# Patient Record
Sex: Female | Born: 1990 | Race: Black or African American | Hispanic: No | Marital: Single | Smoking: Never smoker
Health system: Southern US, Community
[De-identification: ages and names within clinical notes are randomized; demographics above are authoritative.]

## PROBLEM LIST (undated history)

## (undated) ENCOUNTER — Inpatient Hospital Stay (HOSPITAL_COMMUNITY): Payer: BC Managed Care – PPO | Admitting: Obstetrics & Gynecology

## (undated) DIAGNOSIS — Z789 Other specified health status: Secondary | ICD-10-CM

## (undated) HISTORY — PX: NO PAST SURGERIES: SHX2092

---
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
## 2013-02-15 NOTE — L&D Delivery Note (Signed)
Final Labor Progress Note At 0300 pt reports an increased in rectal pressure.  VE AL/C/+1.  FHR remained reassuring with occasional variable decelerations.  Vaginal Delivery Note The pt utilized IV medicine as pain management.   Artificial rupture of membranes today, at 0222, clear.  GBS was negative.  cervical dilation was complete at  0330.  NICHD Category 2.    Pushing with guidance began at  0415.   After 30 minutes of pushing the head, shoulders and the body of a viable female infant "Ava" delivered spontaneously with maternal effort in the ROA in a compound position at 0445/   With vigorous tone and spontaneous cry, the infant was placed on moms abd.  After the umbilical cord was clamped it was cut by the FOB, then cord blood was obtained for evaluation.  Spontaneous delivery of a intact placenta with a 3 vessel cord via Shultz at  [phone].   Episiotomy: None   The vulva, perineum, vaginal vault, rectum and cervix were inspected and revealed a 1 degree vaginal which was repair using a 3-0 vicryl on a CT needle and a superficial left labial laceration which was repaired using a 4-0 vicryl on a SH needle with 25cc of 1% lidocaine.  Postpartum pitocin as ordered.  Fundus firm, lochia minimum, bleeding under control. EBL 150, Pt hemodynamically stable.   Sponge, laps and needle count correct and verified with the primary care nurse.  Attending MD available at all times.    Mom and baby were left in stable condition, baby skin to skin. Routine postpartum orders   Mother unsure about method of contraception   Placenta to pathology: NO     Cord Gases sent to lab: NO Cord blood sent to lab: YES   APGARS:  8 at 1 minute and 9 at 5 minutes Weight:. 6lb 14oz     Felicitas Sine, CNM, MSN 01/14/2014. 5:50 AM

## 2013-06-14 LAB — OB RESULTS CONSOLE HIV ANTIBODY (ROUTINE TESTING): HIV: NONREACTIVE

## 2013-06-14 LAB — OB RESULTS CONSOLE ABO/RH: RH Type: POSITIVE

## 2013-06-14 LAB — OB RESULTS CONSOLE RPR: RPR: NONREACTIVE

## 2013-06-14 LAB — OB RESULTS CONSOLE HEPATITIS B SURFACE ANTIGEN: HEP B S AG: NEGATIVE

## 2013-06-14 LAB — OB RESULTS CONSOLE RUBELLA ANTIBODY, IGM: Rubella: IMMUNE

## 2013-06-14 LAB — OB RESULTS CONSOLE ANTIBODY SCREEN: Antibody Screen: NEGATIVE

## 2013-12-21 LAB — OB RESULTS CONSOLE GC/CHLAMYDIA
Chlamydia: NEGATIVE
Gonorrhea: NEGATIVE

## 2013-12-24 LAB — OB RESULTS CONSOLE GBS: STREP GROUP B AG: NEGATIVE

## 2014-01-13 ENCOUNTER — Inpatient Hospital Stay (HOSPITAL_COMMUNITY)
Admission: AD | Admit: 2014-01-13 | Discharge: 2014-01-16 | DRG: 775 | Disposition: A | Discharge status: Home / Self Care | Payer: BC Managed Care – PPO | Source: Ambulatory Visit | Attending: Obstetrics and Gynecology | Admitting: Obstetrics and Gynecology

## 2014-01-13 ENCOUNTER — Encounter (HOSPITAL_COMMUNITY): Payer: Self-pay | Admitting: *Deleted

## 2014-01-13 ENCOUNTER — Encounter (HOSPITAL_COMMUNITY): Payer: Self-pay

## 2014-01-13 ENCOUNTER — Inpatient Hospital Stay (HOSPITAL_COMMUNITY)
Admission: AD | Admit: 2014-01-13 | Discharge: 2014-01-13 | Disposition: A | Discharge status: Home / Self Care | Payer: BC Managed Care – PPO | Source: Ambulatory Visit | Attending: Obstetrics and Gynecology | Admitting: Obstetrics and Gynecology

## 2014-01-13 DIAGNOSIS — O480 Post-term pregnancy: Secondary | ICD-10-CM | POA: Diagnosis present

## 2014-01-13 DIAGNOSIS — O471 False labor at or after 37 completed weeks of gestation: Secondary | ICD-10-CM | POA: Insufficient documentation

## 2014-01-13 DIAGNOSIS — Z3403 Encounter for supervision of normal first pregnancy, third trimester: Secondary | ICD-10-CM | POA: Diagnosis present

## 2014-01-13 DIAGNOSIS — O9902 Anemia complicating childbirth: Secondary | ICD-10-CM | POA: Diagnosis present

## 2014-01-13 DIAGNOSIS — D649 Anemia, unspecified: Secondary | ICD-10-CM | POA: Diagnosis present

## 2014-01-13 DIAGNOSIS — Z6835 Body mass index (BMI) 35.0-35.9, adult: Secondary | ICD-10-CM | POA: Diagnosis not present

## 2014-01-13 DIAGNOSIS — O99214 Obesity complicating childbirth: Secondary | ICD-10-CM | POA: Diagnosis present

## 2014-01-13 DIAGNOSIS — Z3A40 40 weeks gestation of pregnancy: Secondary | ICD-10-CM | POA: Diagnosis not present

## 2014-01-13 HISTORY — DX: Other specified health status: Z78.9

## 2014-01-13 LAB — CBC
HEMATOCRIT: 33.7 % — AB (ref 36.0–46.0)
Hemoglobin: 11 g/dL — ABNORMAL LOW (ref 12.0–15.0)
MCH: 26.9 pg (ref 26.0–34.0)
MCHC: 32.6 g/dL (ref 30.0–36.0)
MCV: 82.4 fL (ref 78.0–100.0)
PLATELETS: 243 10*3/uL (ref 150–400)
RBC: 4.09 MIL/uL (ref 3.87–5.11)
RDW: 14.2 % (ref 11.5–15.5)
WBC: 11.6 10*3/uL — ABNORMAL HIGH (ref 4.0–10.5)

## 2014-01-13 MED ORDER — LACTATED RINGERS IV SOLN
INTRAVENOUS | Status: DC
  Administered 2014-01-13 (×2): via INTRAVENOUS

## 2014-01-13 MED ORDER — OXYTOCIN 40 UNITS IN LACTATED RINGERS INFUSION - SIMPLE MED
1.0000 m[IU]/min | INTRAVENOUS | Status: DC
Start: 2014-01-13 — End: 2014-01-14
  Filled 2014-01-13: qty 1000

## 2014-01-13 MED ORDER — OXYTOCIN 40 UNITS IN LACTATED RINGERS INFUSION - SIMPLE MED
62.5000 mL/h | INTRAVENOUS | Status: DC
  Administered 2014-01-14: 62.5 mL/h via INTRAVENOUS

## 2014-01-13 MED ORDER — OXYCODONE-ACETAMINOPHEN 5-325 MG PO TABS: 2.0000 | ORAL_TABLET | ORAL | Status: DC | PRN

## 2014-01-13 MED ORDER — ONDANSETRON HCL 4 MG/2ML IJ SOLN: 4.0000 mg | Freq: Four times a day (QID) | INTRAMUSCULAR | Status: DC | PRN

## 2014-01-13 MED ORDER — OXYCODONE-ACETAMINOPHEN 5-325 MG PO TABS: 1.0000 | ORAL_TABLET | ORAL | Status: DC | PRN

## 2014-01-13 MED ORDER — OXYTOCIN BOLUS FROM INFUSION
500.0000 mL | INTRAVENOUS | Status: DC
  Administered 2014-01-14: 500 mL via INTRAVENOUS

## 2014-01-13 MED ORDER — LACTATED RINGERS IV SOLN
500.0000 mL | INTRAVENOUS | Status: DC | PRN
  Administered 2014-01-14: 500 mL via INTRAVENOUS

## 2014-01-13 MED ORDER — CITRIC ACID-SODIUM CITRATE 334-500 MG/5ML PO SOLN: 30.0000 mL | ORAL | Status: DC | PRN

## 2014-01-13 MED ORDER — LIDOCAINE HCL (PF) 1 % IJ SOLN
30.0000 mL | INTRAMUSCULAR | Status: DC | PRN
  Administered 2014-01-14: 30 mL via SUBCUTANEOUS
  Filled 2014-01-13: qty 30

## 2014-01-13 MED ORDER — SODIUM CHLORIDE 0.9 % IV SOLN: 250.0000 mL | INTRAVENOUS | Status: DC | PRN

## 2014-01-13 MED ORDER — SODIUM CHLORIDE 0.9 % IJ SOLN: 3.0000 mL | INTRAMUSCULAR | Status: DC | PRN

## 2014-01-13 MED ORDER — TERBUTALINE SULFATE 1 MG/ML IJ SOLN: 0.2500 mg | Freq: Once | INTRAMUSCULAR | Status: AC | PRN

## 2014-01-13 MED ORDER — NALBUPHINE HCL 10 MG/ML IJ SOLN
5.0000 mg | INTRAMUSCULAR | Status: DC | PRN
  Administered 2014-01-13: 5 mg via INTRAVENOUS
  Filled 2014-01-13: qty 1

## 2014-01-13 MED ORDER — ACETAMINOPHEN 325 MG PO TABS: 650.0000 mg | ORAL_TABLET | ORAL | Status: DC | PRN

## 2014-01-13 MED ORDER — SODIUM CHLORIDE 0.9 % IJ SOLN: 3.0000 mL | Freq: Two times a day (BID) | INTRAMUSCULAR | Status: DC

## 2014-01-13 NOTE — MAU Note (Signed)
Pt presents to MAU with complaints of contractions that started around 3am. Denies any vaginal bleeding or LOF

## 2014-01-13 NOTE — MAU Note (Signed)
Contractions worsening this pm

## 2014-01-13 NOTE — H&P (Signed)
[name] is a 23 y.o. female, G1 P0 at 40.1 weeks  There are no active problems to display for this patient.   Pregnancy Course: Patient entered care at 10.5 weeks.   EDC of 01/12/14 was established by EDD.      US evaluations:   11.4 weeks - Dating: S c/w D, FHR 156, anterverted uterus,  19.6 weeks - Anatomy: wnl expect spine and nuchal fold not seen d/t fetal position,  EFW 11oz - 48%, FHR 156, cervix 3.44cm, vertex, no previa, placenta posterior normal fluid     22.0 weeks - FU: EFW 1lb 2oz - 53%, FHR 145, cervical length 3.54cm, vertex, posterior placenta, normal fluid, spine seen.    Significant prenatal events:   Obesity - BMI 36, mild anemia hgb 11.3 at prenatals, HSV! Last evaluation:   39.3 weeks   VE:1/50/-3 on 01/08/14  Reason for admission:  ctx  Pt States:   Contractions Frequency: q5-6         Contraction severity: strong         Fetal activity: +FM  Pt reports her allergy to codeine is n/v  OB History    Gravida Para Term Preterm AB TAB SAB Ectopic Multiple Living   1              Past Medical History  Diagnosis Date  . Medical history non-contributory    Past Surgical History  Procedure Laterality Date  . No past surgeries     Family History: family history is not on file. Social History:  reports that she has never smoked. She does not have any smokeless tobacco history on file. She reports that she does not drink alcohol or use illicit drugs.   Prenatal Transfer Tool  Maternal Diabetes: No Genetic Screening: Normal Maternal Ultrasounds/Referrals: Normal Fetal Ultrasounds or other Referrals:  None Maternal Substance Abuse:  No Significant Maternal Medications:  None Significant Maternal Lab Results: None   ROS:  See HPI above, all other systems are negative  Allergies  Allergen Reactions  . Codeine Nausea And Vomiting    Dilation: 3.5 Effacement (%): 80 Station: -3 Exam by:: Shepard Keltz CNM Blood pressure 129/77, pulse 104,  temperature 98.7 F (37.1 C), temperature source Oral, resp. rate 18, height 5\' 4"  (1.626 m), weight 204 lb (92.534 kg), last menstrual period 04/07/2013, SpO2 99 %.  Maternal Exam:  Uterine Assessment: Contraction frequency is rare.  Abdomen: Gravid, non tender. Fundal height is aga.  Normal external genitalia, vulva, cervix, uterus and adnexa.  No lesions noted on exam.  Pelvis adequate for delivery.  Fetal presentation: Vertex by VE  Fetal Exam:  Monitor Surveillance : Continuous Monitoring / Intermitting per -  Mode: Ultrasound.  NICHD: Category CTXs: Q 5-427minutes EFW 7bs   Physical Exam: Nursing note and vitals reviewed General: alert and cooperative She appears well nourished Psychiatric: Normal mood and affect. Her behavior is normal Head: Normocephalic Eyes: Pupils are equal, round, and reactive to light Neck: Normal range of motion Cardiovascular: RRR without murmur  Respiratory: CTAB. Effort normal  Abd: soft, non-tender, +BS, no rebound, no guarding  Genitourinary: Vagina normal  Neurological: A&Ox3 Skin: Warm and dry  Musculoskeletal: Normal range of motion  Homan's sign negative bilaterally No evidence of DVTs.  Edema: No edema DTR: 2+ Clonus: None   Prenatal labs: ABO, Rh:  O positive Antibody:  negative Rubella:   immune RPR:   NR HBsAg:   negative HIV:   NR GBS: Negative (11/09 0000) Sickle  cell/Hgb electrophoresis:  WNL Pap:  09/13/12 wnl GC:   negative Chlamydia: negative Genetic screenings:   Glucola:    Assessment:  IUP at 40.0 weeks NICHD: Category 1 Membranes: intact Bishop Score: 4 GBS negative  Plan:  Admit to L&D for expectant management of labor. Possible augmentation options reviewed including foley bulb, AROM and/or pitocin.   IV pain medication per orders PRN Epidural per patient request Foley cath after patient is comfortable with epidural Anticipate SVD  Labor mgmt as ordered  Okay to ambulate around unit with  wireless monitors  Okay to get up and shower without monitoring   May auscultate FHR intermittently,  if expectant management     q 30 min in active labor     q 15 min in transition     q 5 min with pushing.     May ambulate without monitoring.     If no active labor, may do NST q 2 hours.   Attending MD available at all times.  Nykia Turko, CNM, MSN 01/13/2014, 9:25 PM       All information will be confirmed upon admisson

## 2014-01-13 NOTE — Progress Notes (Signed)
Labor Progress  Subjective: Uncomfortable with each ctx, breathing thru it but request IV pain medicine  Objective: BP 129/77 mmHg  Pulse 104  Temp(Src) 98.7 F (37.1 C) (Oral)  Resp 18  Ht 5\' 4"  (1.626 m)  Wt 204 lb (92.534 kg)  BMI 35.00 kg/m2  SpO2 99%  LMP 04/07/2013     FHT:140, +accel, moderate variability, occasional variable decel CTX:  regular, every 4-5 minutes Uterus gravid, soft non tender SVE:  Dilation: 6 Effacement (%): 90 Station: -2 Exam by:: Hensley Aziz CNM   Assessment:  IUP at 40.1 weeks NICHD: Category 2 Membranes:  BBW Labor progress: active/progressing GBS: negative   Plan: Continue expectant labor management Rest/Ambulate/birthball Frequent position changes to facilitate fetal rotation and descent. Will reassess with cervical exam at 0300 or earlier if necessary IV pain medicine or epidural per pt request    Hannah Bishop, CNM, MSN 01/13/2014. 11:26 PM

## 2014-01-13 NOTE — MAU Provider Note (Signed)
Hannah Bishop is a [age] y.o. G1P0 at 40.0 weeks present to MAU c/0 ctx q 5 minutes since this morning at 0300.  She denies vb or lof w/+FM.    History     There are no active problems to display for this patient.   Chief Complaint  Patient presents with  . Labor Eval   HPI  OB History    Gravida Para Term Preterm AB TAB SAB Ectopic Multiple Living   1               History reviewed. No pertinent past medical history.  History reviewed. No pertinent past surgical history.  History reviewed. No pertinent family history.  History  Substance Use Topics  . Smoking status: Never Smoker   . Smokeless tobacco: Not on file  . Alcohol Use: No    Allergies:  Allergies  Allergen Reactions  . Codeine Nausea And Vomiting    Prescriptions prior to admission  Medication Sig Dispense Refill Last Dose  . Prenatal Vit-Fe Fumarate-FA (PRENATAL MULTIVITAMIN) TABS tablet Take 1 tablet by mouth daily at 12 noon.   01/12/2014 at Unknown time    ROS See HPI above, all other systems are negative  Physical Exam   Blood pressure 122/74, pulse 104, temperature 98.2 F (36.8 C), resp. rate 18, height 5\' 4"  (1.626 m), weight 204 lb (92.534 kg), last menstrual period 04/07/2013.  Physical Exam Ext:  WNL ABD: Soft, non tender to palpation, no rebound or guarding SVE: 2-3/70/-2 soft, vertex by A Gagliardo RN   ED Course  Assessment: IUP at  40.1weeks Membranes: intact FHR: Category 1 CTX:  occasional   Plan: 1 hr observation for cervical changes If not significant give pt the option to  1) DC to home with labor precaution or 2) ambulate for 1-2 hours and be recheck for cervical change    Zian Mohamed, CNM, MSN 01/13/2014. 9:10 AM

## 2014-01-13 NOTE — MAU Provider Note (Signed)
Hannah Bishop is a 23 y.o. G1P0 at 40.1 weeks returned to MAU c/o stronger ctx than earlier   History     There are no active problems to display for this patient.   Chief Complaint  Patient presents with  . Labor Eval   HPI  OB History    Gravida Para Term Preterm AB TAB SAB Ectopic Multiple Living   1               Past Medical History  Diagnosis Date  . Medical history non-contributory     Past Surgical History  Procedure Laterality Date  . No past surgeries      History reviewed. No pertinent family history.  History  Substance Use Topics  . Smoking status: Never Smoker   . Smokeless tobacco: Not on file  . Alcohol Use: No    Allergies:  Allergies  Allergen Reactions  . Codeine Nausea And Vomiting    Prescriptions prior to admission  Medication Sig Dispense Refill Last Dose  . Prenatal Vit-Fe Fumarate-FA (PRENATAL MULTIVITAMIN) TABS tablet Take 1 tablet by mouth daily at 12 noon.   01/13/2014 at Unknown time    ROS See HPI above, all other systems are negative  Physical Exam   Blood pressure 129/77, pulse 104, temperature 98.7 F (37.1 C), temperature source Oral, resp. rate 18, height 5\' 4"  (1.626 m), weight 204 lb (92.534 kg), last menstrual period 04/07/2013, SpO2 99 %.  Physical Exam Ext:  WNL ABD: Soft, non tender to palpation, no rebound or guarding SVE: 3-4/80/-3   ED Course  Assessment: IUP at  40.1weeks Membranes: BBW FHR: Category 1 CTX:  occassional  Plan: Admit to Labor for expectant labor management  Shonica Weier, CNM, MSN 01/13/2014. 9:22 PM

## 2014-01-13 NOTE — Discharge Instructions (Signed)

## 2014-01-13 NOTE — MAU Provider Note (Signed)
MAU Addendum Note  No results found for this or any previous visit (from the past 24 hour(s)). Pt elected to go home and return later  Plan: -Discussed need to follow up in office  -Bleeding and labor Precautions -Kick count instruction given -Encouraged to call if any questions or concerns arise prior to next scheduled office visit.  -Discharged to home in stable condition   Kanen Mottola, CNM, MSN 01/13/2014. 9:40 AM

## 2014-01-14 ENCOUNTER — Encounter (HOSPITAL_COMMUNITY): Payer: Self-pay | Admitting: General Practice

## 2014-01-14 LAB — RPR

## 2014-01-14 LAB — HIV ANTIBODY (ROUTINE TESTING W REFLEX): HIV 1&2 Ab, 4th Generation: NONREACTIVE

## 2014-01-14 MED ORDER — ONDANSETRON HCL 4 MG PO TABS
4.0000 mg | ORAL_TABLET | ORAL | Status: DC | PRN
Start: 2014-01-14 — End: 2014-01-16

## 2014-01-14 MED ORDER — LANOLIN HYDROUS EX OINT: TOPICAL_OINTMENT | CUTANEOUS | Status: DC | PRN

## 2014-01-14 MED ORDER — PRENATAL MULTIVITAMIN CH
1.0000 | ORAL_TABLET | Freq: Every day | ORAL | Status: DC
  Administered 2014-01-14 – 2014-01-16 (×3): 1 via ORAL
  Filled 2014-01-14 (×3): qty 1

## 2014-01-14 MED ORDER — DIPHENHYDRAMINE HCL 25 MG PO CAPS: 25.0000 mg | ORAL_CAPSULE | Freq: Four times a day (QID) | ORAL | Status: DC | PRN

## 2014-01-14 MED ORDER — INFLUENZA VAC SPLIT QUAD 0.5 ML IM SUSY: 0.5000 mL | PREFILLED_SYRINGE | INTRAMUSCULAR | Status: DC

## 2014-01-14 MED ORDER — WITCH HAZEL-GLYCERIN EX PADS: 1.0000 "application " | MEDICATED_PAD | CUTANEOUS | Status: DC | PRN

## 2014-01-14 MED ORDER — OXYCODONE-ACETAMINOPHEN 5-325 MG PO TABS
2.0000 | ORAL_TABLET | ORAL | Status: DC | PRN
Start: 2014-01-14 — End: 2014-01-16

## 2014-01-14 MED ORDER — BENZOCAINE-MENTHOL 20-0.5 % EX AERO
1.0000 "application " | INHALATION_SPRAY | CUTANEOUS | Status: DC | PRN
  Administered 2014-01-14: 1 via TOPICAL
  Filled 2014-01-14: qty 56

## 2014-01-14 MED ORDER — ONDANSETRON HCL 4 MG/2ML IJ SOLN
4.0000 mg | INTRAMUSCULAR | Status: DC | PRN
Start: 2014-01-14 — End: 2014-01-16

## 2014-01-14 MED ORDER — SIMETHICONE 80 MG PO CHEW
80.0000 mg | CHEWABLE_TABLET | ORAL | Status: DC | PRN
Start: 2014-01-14 — End: 2014-01-16

## 2014-01-14 MED ORDER — OXYCODONE-ACETAMINOPHEN 5-325 MG PO TABS
1.0000 | ORAL_TABLET | ORAL | Status: DC | PRN
  Administered 2014-01-14: 1 via ORAL
  Filled 2014-01-14: qty 1

## 2014-01-14 MED ORDER — DIBUCAINE 1 % RE OINT: 1.0000 "application " | TOPICAL_OINTMENT | RECTAL | Status: DC | PRN

## 2014-01-14 MED ORDER — SENNOSIDES-DOCUSATE SODIUM 8.6-50 MG PO TABS
2.0000 | ORAL_TABLET | ORAL | Status: DC
  Administered 2014-01-14 – 2014-01-15 (×2): 2 via ORAL
  Filled 2014-01-14 (×2): qty 2

## 2014-01-14 MED ORDER — IBUPROFEN 600 MG PO TABS
600.0000 mg | ORAL_TABLET | Freq: Four times a day (QID) | ORAL | Status: DC
  Administered 2014-01-14 – 2014-01-16 (×10): 600 mg via ORAL
  Filled 2014-01-14 (×9): qty 1

## 2014-01-14 MED ORDER — ZOLPIDEM TARTRATE 5 MG PO TABS
5.0000 mg | ORAL_TABLET | Freq: Every evening | ORAL | Status: DC | PRN
Start: 2014-01-14 — End: 2014-01-16

## 2014-01-14 MED ORDER — TETANUS-DIPHTH-ACELL PERTUSSIS 5-2.5-18.5 LF-MCG/0.5 IM SUSP: 0.5000 mL | Freq: Once | INTRAMUSCULAR | Status: DC

## 2014-01-14 NOTE — Progress Notes (Signed)
Labor Progress  Subjective: Managing the pain with breathing and family support  Objective: BP 96/80 mmHg  Pulse 111  Temp(Src) 98 F (36.7 C) (Oral)  Resp 18  Ht 5\' 4"  (1.626 m)  Wt 204 lb (92.534 kg)  BMI 35.00 kg/m2  SpO2 99%  LMP 04/07/2013     FHT: 130, min-mod variability, early decel,  CTX:  regular, every 3 minutes Uterus gravid, soft non tender SVE:  Dilation: 8.5 Effacement (%): 90 Station: 0 Exam by:: Diezel Mazur CNM  Assessment:  IUP at 40.2 weeks NICHD: Category 2 Membranes:  AROM clear 0222 Labor progress: transition GBS: negative   Plan: Continue labor plan Continuous/intermittent monitoring Rest/Ambulate Frequent position changes to facilitate fetal rotation and descent. Will reassess with cervical exam at 0400 or earlier if necessary    Liberta Gimpel, CNM, MSN 01/14/2014. 2:57 AM

## 2014-01-14 NOTE — Lactation Note (Signed)
This note was copied from the chart of Hannah Bishop. Lactation Consultation Note  Patient Name: Hannah Bishop's Date: 01/14/2014 Reason for consult: Follow-up assessment  Mom desired assistance in getting baby to breast, but baby is too sleepy to latch on at this time.  Hand expression attempted (so as to entice baby), but nothing was expressed at this time (Mom reports having seen colostrum earlier).  Mom reassured.  Sleepy behavior on the 1st day was discussed. Mom knows she can call for assist at any time.   Mom made aware of O/P services, breastfeeding support groups, community resources, and our phone # for post-discharge questions.   Lurline HareRichey, Keelyn Monjaras [facility] 01/14/2014, 2:05 PM

## 2014-01-14 NOTE — Progress Notes (Addendum)
Labor Progress  Subjective: Painful ctx, increase pressure, breathing thru each ctx, family at the bedisde  Objective: BP 129/77 mmHg  Pulse 104  Temp(Src) 98.7 F (37.1 C) (Oral)  Resp 18  Ht 5\' 4"  (1.626 m)  Wt 204 lb (92.534 kg)  BMI 35.00 kg/m2  SpO2 99%  LMP 04/07/2013     FHT: 135, moderate variability, + accel, no decels CTX:  regular, every 3-4 minutes Uterus gravid, soft non tender SVE:  Dilation: 7.5 Effacement (%): 90 Station: -1 Exam by:: Hannah Bishop   Assessment:  IUP at 40.2 weeks NICHD: Category 1 Membranes:  BBW Labor progress: adquate labor progress GBS: negative   Plan: Continue labor plan Continuous/intermittent monitoring Rest/Ambulate Frequent position changes to facilitate fetal rotation and descent. Will reassess with cervical exam at 0300 or earlier if necessary      Hannah Bishop, CNM, MSN 01/14/2014. 1:09 AM

## 2014-01-14 NOTE — Plan of Care (Signed)
Problem: Consults Goal: Postpartum Patient Education (See Patient Education module for education specifics.) Outcome: Completed/Met Date Met:  01/14/14 Goal: Skin Care Protocol Initiated - if Braden Score 18 or less If consults are not indicated, leave blank or document N/A Outcome: Completed/Met Date Met:  01/14/14

## 2014-01-14 NOTE — Lactation Note (Signed)
This note was copied from the chart of Hannah Crystalyn Manygoats. Lactation Consultation Note  P1, Assisted mother with latching in football hold.  FOB undressed baby. Encouraged STS. Rhythmical sucks and swallows observed, some with stimulation. Reviewed massaging breasts to keep her active.  Discussed cluster feeding.   Patient Name: Hannah Pietro CassisRaven Letts ZOXWR'UToday's Date: 01/14/2014 Reason for consult: Follow-up assessment   Maternal Data    Feeding Feeding Type: Breast Fed  LATCH Score/Interventions Latch: Grasps breast easily, tongue down, lips flanged, rhythmical sucking. Intervention(s): Adjust position;Assist with latch;Breast massage  Audible Swallowing: A few with stimulation Intervention(s): Skin to skin;Alternate breast massage  Type of Nipple: Everted at rest and after stimulation  Comfort (Breast/Nipple): Soft / non-tender     Hold (Positioning): Assistance needed to correctly position infant at breast and maintain latch.  LATCH Score: 8  Lactation Tools Discussed/Used     Consult Status Consult Status: Follow-up Date: 01/15/14 Follow-up type: In-patient    Dahlia ByesBerkelhammer, Ruth [facility] 01/14/2014, 5:57 PM

## 2014-01-14 NOTE — Lactation Note (Signed)
This note was copied from the chart of Hannah Hannah Bishop. Lactation Consultation Note  Patient Name: Hannah Bishop Reason for consult: Initial assessment  Initial visit attempted at 7 hours of life. Mom has visitors in room. Mom has my # to call when ready for consult/baby's feeding.  Lurline HareRichey, Shera [facility] Bishop, 12:35 PM

## 2014-01-15 LAB — CBC
HEMATOCRIT: 32.8 % — AB (ref 36.0–46.0)
Hemoglobin: 10.3 g/dL — ABNORMAL LOW (ref 12.0–15.0)
MCH: 26.8 pg (ref 26.0–34.0)
MCHC: 31.4 g/dL (ref 30.0–36.0)
MCV: 85.2 fL (ref 78.0–100.0)
Platelets: 252 10*3/uL (ref 150–400)
RBC: 3.85 MIL/uL — ABNORMAL LOW (ref 3.87–5.11)
RDW: 14.8 % (ref 11.5–15.5)
WBC: 11.5 10*3/uL — ABNORMAL HIGH (ref 4.0–10.5)

## 2014-01-15 NOTE — Progress Notes (Signed)
Hannah Bishop  Post Partum Day 1:S/P SVD with 1st Degree Vaginal and Superficial Left Labial  Subjective: Patient up ad lib, denies syncope or dizziness. Reports consuming regular diet without issues and denies N/V. Denies issues with urination and reports bleeding is "good."  Patient is breast feeding and reports going pretty good.  Unsure of desired birth control method for postpartum contraception.    Objective: Filed Vitals:   01/14/14 0800 01/14/14 1242 01/14/14 1737 01/15/14 0527  BP: 113/61 108/61 118/68 103/62  Pulse: 87 78 87 85  Temp: 98 F (36.7 C) 98 F (36.7 C) 98.2 F (36.8 C) 98.1 F (36.7 C)  TempSrc: Oral Oral Oral Oral  Resp: 18 18 18 18   Height:      Weight:      SpO2:        Recent Labs  01/13/14 2145 01/15/14 0620  HGB 11.0* 10.3*  HCT 33.7* 32.8*    Physical Exam:  General appearance: alert, cooperative and no distress Lungs: clear to auscultation bilaterally Breasts: normal appearance, no masses or tenderness Heart: regular rate and rhythm, S1, S2 normal, no murmur, click, rub or gallop Abdomen: soft, non-tender; bowel sounds normal; no masses,  no organomegaly Extremities: extremities normal, atraumatic, no cyanosis or edema Skin: Skin color, texture, turgor normal. No rashes or lesions Lochia: Scant Laceration: Not assessed d/t location Uterine Fundus: firm, U/-2 DVT Evaluation: Negative Homan's sign. No significant calf/ankle edema.  Assessment S/P Vaginal Delivery-Day 1 Normal [employer] Breastfeeding Unsure BCM Hemodynamically Stable  Plan: Discussed recommended BCM for new, breastfeeding mom--information added to discharge sheet Plan for discharge tomorrow per pediatrician recommendation Continue current care as ordered Dr. Carmela HurtE. [place] to be updated on patient status   Hannah Bishop, CNM 01/15/2014, 9:03 AM

## 2014-01-15 NOTE — Discharge Instructions (Signed)
Medroxyprogesterone injection [Contraceptive] What is this medicine? MEDROXYPROGESTERONE (me DROX ee proe JES te rone) contraceptive injections prevent pregnancy. They provide effective birth control for 3 months. Depo-subQ Provera 104 is also used for treating pain related to endometriosis. This medicine may be used for other purposes; ask your health care provider or pharmacist if you have questions. COMMON BRAND NAME(S): Depo-Provera, Depo-subQ Provera 104 What should I tell my health care provider before I take this medicine? They need to know if you have any of these conditions: -frequently drink alcohol -asthma -blood vessel disease or a history of a blood clot in the lungs or legs -bone disease such as osteoporosis -breast cancer -diabetes -eating disorder (anorexia nervosa or bulimia) -high blood pressure -HIV infection or AIDS -kidney disease -liver disease -mental depression -migraine -seizures (convulsions) -stroke -tobacco smoker -vaginal bleeding -an unusual or allergic reaction to medroxyprogesterone, other hormones, medicines, foods, dyes, or preservatives -pregnant or trying to get pregnant -breast-feeding How should I use this medicine? Depo-Provera Contraceptive injection is given into a muscle. Depo-subQ Provera 104 injection is given under the skin. These injections are given by a health care professional. You must not be pregnant before getting an injection. The injection is usually given during the first 5 days after the start of a menstrual period or 6 weeks after delivery of a baby. Talk to your pediatrician regarding the use of this medicine in children. Special care may be needed. These injections have been used in female children who have started having menstrual periods. Overdosage: If you think you have taken too much of this medicine contact a poison control center or emergency room at once. NOTE: This medicine is only for you. Do not share this medicine  with others. What if I miss a dose? Try not to miss a dose. You must get an injection once every 3 months to maintain birth control. If you cannot keep an appointment, call and reschedule it. If you wait longer than 13 weeks between Depo-Provera contraceptive injections or longer than 14 weeks between Depo-subQ Provera 104 injections, you could get pregnant. Use another method for birth control if you miss your appointment. You may also need a pregnancy test before receiving another injection. What may interact with this medicine? Do not take this medicine with any of the following medications: -bosentan This medicine may also interact with the following medications: -aminoglutethimide -antibiotics or medicines for infections, especially rifampin, rifabutin, rifapentine, and griseofulvin -aprepitant -barbiturate medicines such as phenobarbital or primidone -bexarotene -carbamazepine -medicines for seizures like ethotoin, felbamate, oxcarbazepine, phenytoin, topiramate -modafinil -St. John's wort This list may not describe all possible interactions. Give your health care provider a list of all the medicines, herbs, non-prescription drugs, or dietary supplements you use. Also tell them if you smoke, drink alcohol, or use illegal drugs. Some items may interact with your medicine. What should I watch for while using this medicine? This drug does not protect you against HIV infection (AIDS) or other sexually transmitted diseases. Use of this product may cause you to lose calcium from your bones. Loss of calcium may cause weak bones (osteoporosis). Only use this product for more than 2 years if other forms of birth control are not right for you. The longer you use this product for birth control the more likely you will be at risk for weak bones. Ask your health care professional how you can keep strong bones. You may have a change in bleeding pattern or irregular periods. Many females stop having    periods while taking this drug. If you have received your injections on time, your chance of being pregnant is very low. If you think you may be pregnant, see your health care professional as soon as possible. Tell your health care professional if you want to get pregnant within the next year. The effect of this medicine may last a long time after you get your last injection. What side effects may I notice from receiving this medicine? Side effects that you should report to your doctor or health care professional as soon as possible: -allergic reactions like skin rash, itching or hives, swelling of the face, lips, or tongue -breast tenderness or discharge -breathing problems -changes in vision -depression -feeling faint or lightheaded, falls -fever -pain in the abdomen, chest, groin, or leg -problems with balance, talking, walking -unusually weak or tired -yellowing of the eyes or skin Side effects that usually do not require medical attention (report to your doctor or health care professional if they continue or are bothersome): -acne -fluid retention and swelling -headache -irregular periods, spotting, or absent periods -temporary pain, itching, or skin reaction at site where injected -weight gain This list may not describe all possible side effects. Call your doctor for medical advice about side effects. You may report side effects to FDA at 1-800-FDA-1088. Where should I keep my medicine? This does not apply. The injection will be given to you by a health care professional. NOTE: This sheet is a summary. It may not cover all possible information. If you have questions about this medicine, talk to your doctor, pharmacist, or health care provider.  2015, [employer]. (2008-02-23 18:37:56) Etonogestrel implant What is this medicine? ETONOGESTREL (et oh noe JES trel) is a contraceptive (birth control) device. It is used to prevent pregnancy. It can be used for up to 3  years. This medicine may be used for other purposes; ask your health care provider or pharmacist if you have questions. COMMON BRAND NAME(S): Implanon, Nexplanon What should I tell my health care provider before I take this medicine? They need to know if you have any of these conditions: -abnormal vaginal bleeding -blood vessel disease or blood clots -cancer of the breast, cervix, or liver -depression -diabetes -gallbladder disease -headaches -heart disease or recent heart attack -high blood pressure -high cholesterol -kidney disease -liver disease -renal disease -seizures -tobacco smoker -an unusual or allergic reaction to etonogestrel, other hormones, anesthetics or antiseptics, medicines, foods, dyes, or preservatives -pregnant or trying to get pregnant -breast-feeding How should I use this medicine? This device is inserted just under the skin on the inner side of your upper arm by a health care professional. Talk to your pediatrician regarding the use of this medicine in children. Special care may be needed. Overdosage: If you think you've taken too much of this medicine contact a poison control center or emergency room at once. Overdosage: If you think you have taken too much of this medicine contact a poison control center or emergency room at once. NOTE: This medicine is only for you. Do not share this medicine with others. What if I miss a dose? This does not apply. What may interact with this medicine? Do not take this medicine with any of the following medications: -amprenavir -bosentan -fosamprenavir This medicine may also interact with the following medications: -barbiturate medicines for inducing sleep or treating seizures -certain medicines for fungal infections like ketoconazole and itraconazole -griseofulvin -medicines to treat seizures like carbamazepine, felbamate, oxcarbazepine, phenytoin, topiramate -modafinil -phenylbutazone -rifampin -some medicines  to treat HIV infection like atazanavir, indinavir, lopinavir, nelfinavir, tipranavir, ritonavir -St. John's wort This list may not describe all possible interactions. Give your health care provider a list of all the medicines, herbs, non-prescription drugs, or dietary supplements you use. Also tell them if you smoke, drink alcohol, or use illegal drugs. Some items may interact with your medicine. What should I watch for while using this medicine? This product does not protect you against HIV infection (AIDS) or other sexually transmitted diseases. You should be able to feel the implant by pressing your fingertips over the skin where it was inserted. Tell your doctor if you cannot feel the implant. What side effects may I notice from receiving this medicine? Side effects that you should report to your doctor or health care professional as soon as possible: -allergic reactions like skin rash, itching or hives, swelling of the face, lips, or tongue -breast lumps -changes in vision -confusion, trouble speaking or understanding -dark urine -depressed mood -general ill feeling or flu-like symptoms -light-colored stools -loss of appetite, nausea -right upper belly pain -severe headaches -severe pain, swelling, or tenderness in the abdomen -shortness of breath, chest pain, swelling in a leg -signs of pregnancy -sudden numbness or weakness of the face, arm or leg -trouble walking, dizziness, loss of balance or coordination -unusual vaginal bleeding, discharge -unusually weak or tired -yellowing of the eyes or skin Side effects that usually do not require medical attention (Report these to your doctor or health care professional if they continue or are bothersome.): -acne -breast pain -changes in weight -cough -fever or chills -headache -irregular menstrual bleeding -itching, burning, and vaginal discharge -pain or difficulty passing urine -sore throat This list may not describe all  possible side effects. Call your doctor for medical advice about side effects. You may report side effects to FDA at 1-800-FDA-1088. Where should I keep my medicine? This drug is given in a hospital or clinic and will not be stored at home. NOTE: This sheet is a summary. It may not cover all possible information. If you have questions about this medicine, talk to your doctor, pharmacist, or health care provider.  2015, [employer] Standard. (2011-08-09 15:37:45) Intrauterine Device Information An intrauterine device (IUD) is inserted into your uterus to prevent pregnancy. There are two types of IUDs available:   Copper IUD--This type of IUD is wrapped in copper wire and is placed inside the uterus. Copper makes the uterus and fallopian tubes produce a fluid that kills sperm. The copper IUD can stay in place for 10 years.  Hormone IUD--This type of IUD contains the hormone progestin (synthetic progesterone). The hormone thickens the cervical mucus and prevents sperm from entering the uterus. It also thins the uterine lining to prevent implantation of a fertilized egg. The hormone can weaken or kill the sperm that get into the uterus. One type of hormone IUD can stay in place for 5 years, and another type can stay in place for 3 years. Your health care provider will make sure you are a good candidate for a contraceptive IUD. Discuss with your health care provider the possible side effects.  ADVANTAGES OF AN INTRAUTERINE DEVICE  IUDs are highly effective, reversible, long acting, and low maintenance.   There are no estrogen-related side effects.   An IUD can be used when breastfeeding.   IUDs are not associated with weight gain.   The copper IUD works immediately after insertion.   The hormone IUD works right away if inserted within 7 days  of your period starting. You will need to use a backup method of birth control for 7 days if the hormone IUD is inserted at any other time in your  cycle.  The copper IUD does not interfere with your female hormones.   The hormone IUD can make heavy menstrual periods lighter and decrease cramping.   The hormone IUD can be used for 3 or 5 years.   The copper IUD can be used for 10 years. DISADVANTAGES OF AN INTRAUTERINE DEVICE  The hormone IUD can be associated with irregular bleeding patterns.   The copper IUD can make your menstrual flow heavier and more painful.   You may experience cramping and vaginal bleeding after insertion.  Document Released: 01/06/2004 Document Revised: 10/04/2012 Document Reviewed: 07/23/2012 [facility] Patient Information 2015 [place], [place]. This information is not intended to replace advice given to you by your health care provider. Make sure you discuss any questions you have with your health care provider.

## 2014-01-15 NOTE — Discharge Summary (Signed)
Vaginal Delivery Discharge Summary  ALL information will be verified prior to discharge  Hannah Bishop  DOB:    Sep 05, 1990 MRN:    [id number] CSN:    [id number]  Date of admission:                  01/13/14  Date of discharge:                   01/16/14  Procedures this admission: SVD   Date of Delivery: 01/14/14  Newborn Data:  Live born  Information for the patient's newborn:  Hannah, Bishop Girl Bishop [[id number]]  female  Live born female  Birth Weight: 6 lb 14 oz (3118 g) APGAR: 8, 9  Home with mother. Name: Hannah Bishop  History of Present Illness: Ms. Hannah Bishop is a 23 y.o. female, G1P1001, who presents at [redacted]w[redacted]d weeks gestation. The patient has been followed at the [facility] and Gynecology division of [facility]. She was admitted onset of labor. Her pregnancy has been complicated by:  Patient Active Problem List   Diagnosis Date Noted  . NSVD (normal spontaneous vaginal delivery) 01/14/2014    Hospital course: The patient was admitted for Labor.   Her labor was not complicated. She proceeded to have a vaginal delivery of a healthy infant. Her delivery was not complicated. Her postpartum course was not complicated. She was discharged to home on postpartum day 2 doing well.  Feeding: breast  Contraception: unsure of contraception Pt understands the risks birth control are not limited to irregular bleeding, formation of DVT, fluid fluctuations, elevation in blood pressure, stroke, breast tenderness and liver damage.  She states she will report any serious side effects.  She has been given verbal and written instructions and voiced a clear understanding.    Discharge hemoglobin: HEMOGLOBIN  Date Value Ref Range Status  01/15/2014 10.3* 12.0 - 15.0 g/dL Final   HCT  Date Value Ref Range Status  01/15/2014 32.8* 36.0 - 46.0 % Final    PreNatal Labs ABO, Rh: O/Positive/-- (04/30 0000)   Antibody: Negative (04/30 0000) Rubella:    immune RPR: NON REAC (11/29 2145)  HBsAg: Negative (04/30 0000)  HIV: NONREACTIVE (11/29 2145)  GBS: Negative (11/09 0000)  Discharge Physical Exam:  General: alert and cooperative Lochia: appropriate Uterine Fundus: firm Incision: healing well DVT Evaluation: No evidence of DVT seen on physical exam.  Intrapartum Procedures: spontaneous vaginal delivery Postpartum Procedures: none Complications-Operative and Postpartum: 1st Degree Vaginal and Superficial Left Labial degree perineal laceration  Discharge Diagnoses: Term Pregnancy-delivered, anemia  Activity:           pelvic rest Diet:                routine Medications: PNV, Ibuprofen, Iron and Percocet Condition:      stable     Postpartum Teaching: Nutrition, exercise, return to work or school, family visits, sexual activity, home rest, vaginal bleeding, pelvic rest, family planning, s/s of PPD, breast care peri-care and incision care   Discharge to: home  Follow-up Information    Follow up with [facility]. Schedule an appointment as soon as possible for a visit in 6 weeks.   Specialty:  Obstetrics and Gynecology   Why:  Postpartum check up   Contact information:   [address] [place] [address] [phone]       Adelina Mings, CNM, MSN 01/15/2014. 8:37 PM   Postpartum Care After Vaginal Delivery  After you deliver your newborn (postpartum period), the usual stay in the hospital is 24 72 hours. If there were problems with your labor or delivery, or if you have other medical problems, you might be in the hospital longer.  While you are in the hospital, you will receive help and instructions on how to care for yourself and your newborn during the postpartum period.  While you are in the hospital:  Be sure to tell your nurses if you have pain or discomfort, as well as where you feel the pain and what makes the pain worse.  If you had an incision  made near your vagina (episiotomy) or if you had some tearing during delivery, the nurses may put ice packs on your episiotomy or tear. The ice packs may help to reduce the pain and swelling.  If you are breastfeeding, you may feel uncomfortable contractions of your uterus for a couple of weeks. This is normal. The contractions help your uterus get back to normal size.  It is normal to have some bleeding after delivery.  For the first 1 3 days after delivery, the flow is red and the amount may be similar to a period.  It is common for the flow to start and stop.  In the first few days, you may pass some small clots. Let your nurses know if you begin to pass large clots or your flow increases.  Do not  flush blood clots down the toilet before having the nurse look at them.  During the next 3 10 days after delivery, your flow should become more watery and pink or brown-tinged in color.  Ten to fourteen days after delivery, your flow should be a small amount of yellowish-white discharge.  The amount of your flow will decrease over the first few weeks after delivery. Your flow may stop in 6 8 weeks. Most women have had their flow stop by 12 weeks after delivery.  You should change your sanitary pads frequently.  Wash your hands thoroughly with soap and water for at least 20 seconds after changing pads, using the toilet, or before holding or feeding your newborn.  You should feel like you need to empty your bladder within the first 6 8 hours after delivery.  In case you become weak, lightheaded, or faint, call your nurse before you get out of bed for the first time and before you take a shower for the first time.  Within the first few days after delivery, your breasts may begin to feel tender and full. This is called engorgement. Breast tenderness usually goes away within 48 72 hours after engorgement occurs. You may also notice milk leaking from your breasts. If you are not breastfeeding, do  not stimulate your breasts. Breast stimulation can make your breasts produce more milk.  Spending as much time as possible with your newborn is very important. During this time, you and your newborn can feel close and get to know each other. Having your newborn stay in your room (rooming in) will help to strengthen the bond with your newborn. It will give you time to get to know your newborn and become comfortable caring for your newborn.  Your hormones change after delivery. Sometimes the hormone changes can temporarily cause you to feel sad or tearful. These feelings should not last more than a few days. If these feelings last longer than that, you should talk to your caregiver.  If desired, talk to your caregiver about methods of family planning  or contraception.  Talk to your caregiver about immunizations. Your caregiver may want you to have the following immunizations before leaving the hospital:  Tetanus, diphtheria, and pertussis (Tdap) or tetanus and diphtheria (Td) immunization. It is very important that you and your family (including grandparents) or others caring for your newborn are up-to-date with the Tdap or Td immunizations. The Tdap or Td immunization can help protect your newborn from getting ill.  Rubella immunization.  Varicella (chickenpox) immunization.  Influenza immunization. You should receive this annual immunization if you did not receive the immunization during your pregnancy. Document Released: 11/29/2006 Document Revised: 10/27/2011 Document Reviewed: 09/29/2011 [facility] Patient Information 2014 [place], [place].   Postpartum Depression and Baby Blues  The postpartum period begins right after the birth of a baby. During this time, there is often a great amount of joy and excitement. It is also a time of considerable changes in the life of the parent(s). Regardless of how many times a mother gives birth, each child brings new challenges and dynamics to the family.  It is not unusual to have feelings of excitement accompanied by confusing shifts in moods, emotions, and thoughts. All mothers are at risk of developing postpartum depression or the "baby blues." These mood changes can occur right after giving birth, or they may occur many months after giving birth. The baby blues or postpartum depression can be mild or severe. Additionally, postpartum depression can resolve rather quickly, or it can be a long-term condition. CAUSES Elevated hormones and their rapid decline are thought to be a main cause of postpartum depression and the baby blues. There are a number of hormones that radically change during and after pregnancy. Estrogen and progesterone usually decrease immediately after delivering your baby. The level of thyroid hormone and various cortisol steroids also rapidly drop. Other factors that play a major role in these changes include major life events and genetics.  RISK FACTORS If you have any of the following risks for the baby blues or postpartum depression, know what symptoms to watch out for during the postpartum period. Risk factors that may increase the likelihood of getting the baby blues or postpartum depression include: 1. Havinga personal or family history of depression. 2. Having depression while being pregnant. 3. Having premenstrual or oral contraceptive-associated mood issues. 4. Having exceptional life stress. 5. Having marital conflict. 6. Lacking a social support network. 7. Having a baby with special needs. 8. Having health problems such as diabetes. SYMPTOMS Baby blues symptoms include:  Brief fluctuations in mood, such as going from extreme happiness to sadness.  Decreased concentration.  Difficulty sleeping.  Crying spells, tearfulness.  Irritability.  Anxiety. Postpartum depression symptoms typically begin within the first month after giving birth. These symptoms include:  Difficulty sleeping or excessive  sleepiness.  Marked weight loss.  Agitation.  Feelings of worthlessness.  Lack of interest in activity or food. Postpartum psychosis is a very concerning condition and can be dangerous. Fortunately, it is rare. Displaying any of the following symptoms is cause for immediate medical attention. Postpartum psychosis symptoms include:  Hallucinations and delusions.  Bizarre or disorganized behavior.  Confusion or disorientation. DIAGNOSIS  A diagnosis is made by an evaluation of your symptoms. There are no medical or lab tests that lead to a diagnosis, but there are various questionnaires that a caregiver may use to identify those with the baby blues, postpartum depression, or psychosis. Often times, a screening tool called the [place] Postnatal Depression Scale is used to diagnose depression in  the postpartum period.  TREATMENT The baby blues usually goes away on its own in 1 to 2 weeks. Social support is often all that is needed. You should be encouraged to get adequate sleep and rest. Occasionally, you may be given medicines to help you sleep.  Postpartum depression requires treatment as it can last several months or longer if it is not treated. Treatment may include individual or group therapy, medicine, or both to address any social, physiological, and psychological factors that may play a role in the depression. Regular exercise, a healthy diet, rest, and social support may also be strongly recommended.  Postpartum psychosis is more serious and needs treatment right away. Hospitalization is often needed. HOME CARE INSTRUCTIONS  Get as much rest as you can. Nap when the baby sleeps.  Exercise regularly. Some women find yoga and walking to be beneficial.  Eat a balanced and nourishing diet.  Do little things that you enjoy. Have a cup of tea, take a bubble bath, read your favorite magazine, or listen to your favorite music.  Avoid alcohol.  Ask for help with household chores,  cooking, grocery shopping, or running errands as needed. Do not try to do everything.  Talk to people close to you about how you are feeling. Get support from your partner, family members, friends, or other new moms.  Try to stay positive in how you think. Think about the things you are grateful for.  Do not spend a lot of time alone.  Only take medicine as directed by your caregiver.  Keep all your postpartum appointments.  Let your caregiver know if you have any concerns. SEEK MEDICAL CARE IF: You are having a reaction or problems with your medicine. SEEK IMMEDIATE MEDICAL CARE IF:  You have suicidal feelings.  You feel you may harm the baby or someone else. Document Released: 11/06/2003 Document Revised: 04/26/2011 Document Reviewed: 12/08/2010 [facility] Patient Information 2014 [place], [place].     Breastfeeding Deciding to breastfeed is one of the best choices you can make for you and your baby. A change in hormones during pregnancy causes your breast tissue to grow and increases the number and size of your milk ducts. These hormones also allow proteins, sugars, and fats from your blood supply to make breast milk in your milk-producing glands. Hormones prevent breast milk from being released before your baby is born as well as prompt milk flow after birth. Once breastfeeding has begun, thoughts of your baby, as well as his or her sucking or crying, can stimulate the release of milk from your milk-producing glands.  BENEFITS OF BREASTFEEDING For Your Baby  Your first milk (colostrum) helps your baby's digestive system function better.   There are antibodies in your milk that help your baby fight off infections.   Your baby has a lower incidence of asthma, allergies, and sudden infant death syndrome.   The nutrients in breast milk are better for your baby than infant formulas and are designed uniquely for your baby's needs.   Breast milk improves your baby's brain  development.   Your baby is less likely to develop other conditions, such as childhood obesity, asthma, or type 2 diabetes mellitus.  For You   Breastfeeding helps to create a very special bond between you and your baby.   Breastfeeding is convenient. Breast milk is always available at the correct temperature and costs nothing.   Breastfeeding helps to burn calories and helps you lose the weight gained during pregnancy.   Breastfeeding  makes your uterus contract to its prepregnancy size faster and slows bleeding (lochia) after you give birth.   Breastfeeding helps to lower your risk of developing type 2 diabetes mellitus, osteoporosis, and breast or ovarian cancer later in life. SIGNS THAT YOUR BABY IS HUNGRY Early Signs of Hunger  Increased alertness or activity.  Stretching.  Movement of the head from side to side.  Movement of the head and opening of the mouth when the corner of the mouth or cheek is stroked (rooting).  Increased sucking sounds, smacking lips, cooing, sighing, or squeaking.  Hand-to-mouth movements.  Increased sucking of fingers or hands. Late Signs of Hunger  Fussing.  Intermittent crying. Extreme Signs of Hunger Signs of extreme hunger will require calming and consoling before your baby will be able to breastfeed successfully. Do not wait for the following signs of extreme hunger to occur before you initiate breastfeeding:   Restlessness.  A loud, strong cry.   Screaming.   BREASTFEEDING BASICS Breastfeeding Initiation  Find a comfortable place to sit or lie down, with your neck and back well supported.  Place a pillow or rolled up blanket under your baby to bring him or her to the level of your breast (if you are seated). Nursing pillows are specially designed to help support your arms and your baby while you breastfeed.  Make sure that your baby's abdomen is facing your abdomen.   Gently massage your breast. With your fingertips,  massage from your chest wall toward your nipple in a circular motion. This encourages milk flow. You may need to continue this action during the feeding if your milk flows slowly.  Support your breast with 4 fingers underneath and your thumb above your nipple. Make sure your fingers are well away from your nipple and your baby's mouth.   Stroke your baby's lips gently with your finger or nipple.   When your baby's mouth is open wide enough, quickly bring your baby to your breast, placing your entire nipple and as much of the colored area around your nipple (areola) as possible into your baby's mouth.   More areola should be visible above your baby's upper lip than below the lower lip.   Your baby's tongue should be between his or her lower gum and your breast.   Ensure that your baby's mouth is correctly positioned around your nipple (latched). Your baby's lips should create a seal on your breast and be turned out (everted).  It is common for your baby to suck about 2-3 minutes in order to start the flow of breast milk. Latching Teaching your baby how to latch on to your breast properly is very important. An improper latch can cause nipple pain and decreased milk supply for you and poor weight gain in your baby. Also, if your baby is not latched onto your nipple properly, he or she may swallow some air during feeding. This can make your baby fussy. Burping your baby when you switch breasts during the feeding can help to get rid of the air. However, teaching your baby to latch on properly is still the best way to prevent fussiness from swallowing air while breastfeeding. Signs that your baby has successfully latched on to your nipple:    Silent tugging or silent sucking, without causing you pain.   Swallowing heard between every 3-4 sucks.    Muscle movement above and in front of his or her ears while sucking.  Signs that your baby has not successfully latched on  to nipple:    Sucking sounds or smacking sounds from your baby while breastfeeding.  Nipple pain. If you think your baby has not latched on correctly, slip your finger into the corner of your baby's mouth to break the suction and place it between your baby's gums. Attempt breastfeeding initiation again. Signs of Successful Breastfeeding Signs from your baby:   A gradual decrease in the number of sucks or complete cessation of sucking.   Falling asleep.   Relaxation of his or her body.   Retention of a small amount of milk in his or her mouth.   Letting go of your breast by himself or herself. Signs from you:  Breasts that have increased in firmness, weight, and size 1-3 hours after feeding.   Breasts that are softer immediately after breastfeeding.  Increased milk volume, as well as a change in milk consistency and color by the fifth day of breastfeeding.   Nipples that are not sore, cracked, or bleeding. Signs That Your Pecola LeisureBaby is Getting Enough Milk  Wetting at least 3 diapers in a 24-hour period. The urine should be clear and pale yellow by age 30 days.  At least 3 stools in a 24-hour period by age 30 days. The stool should be soft and yellow.  At least 3 stools in a 24-hour period by age 23 days. The stool should be seedy and yellow.  No loss of weight greater than 10% of birth weight during the first 253 days of age.  Average weight gain of 4-7 ounces (113-198 g) per week after age 45 days.  Consistent daily weight gain by age 30 days, without weight loss after the age of 2 weeks. After a feeding, your baby may spit up a small amount. This is common. BREASTFEEDING FREQUENCY AND DURATION Frequent feeding will help you make more milk and can prevent sore nipples and breast engorgement. Breastfeed when you feel the need to reduce the fullness of your breasts or when your baby shows signs of hunger. This is called "breastfeeding on demand." Avoid introducing a pacifier to your baby while  you are working to establish breastfeeding (the first 4-6 weeks after your baby is born). After this time you may choose to use a pacifier. Research has shown that pacifier use during the first year of a baby's life decreases the risk of sudden infant death syndrome (SIDS). Allow your baby to feed on each breast as long as he or she wants. Breastfeed until your baby is finished feeding. When your baby unlatches or falls asleep while feeding from the first breast, offer the second breast. Because newborns are often sleepy in the first few weeks of life, you may need to awaken your baby to get him or her to feed. Breastfeeding times will vary from baby to baby. However, the following rules can serve as a guide to help you ensure that your baby is properly fed:  Newborns (babies 324 weeks of age or younger) may breastfeed every 1-3 hours.  Newborns should not go longer than 3 hours during the day or 5 hours during the night without breastfeeding.  You should breastfeed your baby a minimum of 8 times in a 24-hour period until you begin to introduce solid foods to your baby at around 626 months of age. BREAST MILK PUMPING Pumping and storing breast milk allows you to ensure that your baby is exclusively fed your breast milk, even at times when you are unable to breastfeed. This is especially important if  you are going back to work while you are still breastfeeding or when you are not able to be present during feedings. Your lactation consultant can give you guidelines on how long it is safe to store breast milk.  A breast pump is a machine that allows you to pump milk from your breast into a sterile bottle. The pumped breast milk can then be stored in a refrigerator or freezer. Some breast pumps are operated by hand, while others use electricity. Ask your lactation consultant which type will work best for you. Breast pumps can be purchased, but some hospitals and breastfeeding support groups lease breast pumps on  a monthly basis. A lactation consultant can teach you how to hand express breast milk, if you prefer not to use a pump.  CARING FOR YOUR BREASTS WHILE YOU BREASTFEED Nipples can become dry, cracked, and sore while breastfeeding. The following recommendations can help keep your breasts moisturized and healthy:  Avoid using soap on your nipples.   Wear a supportive bra. Although not required, special nursing bras and tank tops are designed to allow access to your breasts for breastfeeding without taking off your entire bra or top. Avoid wearing underwire-style bras or extremely tight bras.  Air dry your nipples for 3-624minutes after each feeding.   Use only cotton bra pads to absorb leaked breast milk. Leaking of breast milk between feedings is normal.   Use lanolin on your nipples after breastfeeding. Lanolin helps to maintain your skin's normal moisture barrier. If you use pure lanolin, you do not need to wash it off before feeding your baby again. Pure lanolin is not toxic to your baby. You may also hand express a few drops of breast milk and gently massage that milk into your nipples and allow the milk to air dry. In the first few weeks after giving birth, some women experience extremely full breasts (engorgement). Engorgement can make your breasts feel heavy, warm, and tender to the touch. Engorgement peaks within 3-5 days after you give birth. The following recommendations can help ease engorgement:  Completely empty your breasts while breastfeeding or pumping. You may want to start by applying warm, moist heat (in the shower or with warm water-soaked hand towels) just before feeding or pumping. This increases circulation and helps the milk flow. If your baby does not completely empty your breasts while breastfeeding, pump any extra milk after he or she is finished.  Wear a snug bra (nursing or regular) or tank top for 1-2 days to signal your body to slightly decrease milk  production.  Apply ice packs to your breasts, unless this is too uncomfortable for you.  Make sure that your baby is latched on and positioned properly while breastfeeding. If engorgement persists after 48 hours of following these recommendations, contact your health care provider or a Advertising copywriterlactation consultant. OVERALL HEALTH CARE RECOMMENDATIONS WHILE BREASTFEEDING  Eat healthy foods. Alternate between meals and snacks, eating 3 of each per day. Because what you eat affects your breast milk, some of the foods may make your baby more irritable than usual. Avoid eating these foods if you are sure that they are negatively affecting your baby.  Drink milk, fruit juice, and water to satisfy your thirst (about 10 glasses a day).   Rest often, relax, and continue to take your prenatal vitamins to prevent fatigue, stress, and anemia.  Continue breast self-awareness checks.  Avoid chewing and smoking tobacco.  Avoid alcohol and drug use. Some medicines that may be harmful  to your baby can pass through breast milk. It is important to ask your health care provider before taking any medicine, including all over-the-counter and prescription medicine as well as vitamin and herbal supplements. It is possible to become pregnant while breastfeeding. If birth control is desired, ask your health care provider about options that will be safe for your baby. SEEK MEDICAL CARE IF:   You feel like you want to stop breastfeeding or have become frustrated with breastfeeding.  You have painful breasts or nipples.  Your nipples are cracked or bleeding.  Your breasts are red, tender, or warm.  You have a swollen area on either breast.  You have a fever or chills.  You have nausea or vomiting.  You have drainage other than breast milk from your nipples.  Your breasts do not become full before feedings by the fifth day after you give birth.  You feel sad and depressed.  Your baby is too sleepy to eat  well.  Your baby is having trouble sleeping.   Your baby is wetting less than 3 diapers in a 24-hour period.  Your baby has less than 3 stools in a 24-hour period.  Your baby's skin or the white part of his or her eyes becomes yellow.   Your baby is not gaining weight by 305 days of age. SEEK IMMEDIATE MEDICAL CARE IF:   Your baby is overly tired (lethargic) and does not want to wake up and feed.  Your baby develops an unexplained fever. Document Released: 02/01/2005 Document Revised: 02/06/2013 Document Reviewed: 07/26/2012 [facility] Patient Information 2015 [place], [place]. This information is not intended to replace advice given to you by your health care provider. Make sure you discuss any questions you have with your health care provider.

## 2014-01-16 MED ORDER — OXYCODONE-ACETAMINOPHEN 5-325 MG PO TABS: 1.0000 | ORAL_TABLET | ORAL | Status: AC | PRN

## 2014-01-16 MED ORDER — IBUPROFEN 600 MG PO TABS: 600.0000 mg | ORAL_TABLET | Freq: Four times a day (QID) | ORAL | Status: AC

## 2014-01-16 MED ORDER — FERROUS SULFATE 325 (65 FE) MG PO TBEC: 325.0000 mg | DELAYED_RELEASE_TABLET | Freq: Two times a day (BID) | ORAL | Status: AC

## 2014-01-16 NOTE — Lactation Note (Signed)
This note was copied from the chart of Hannah Mae Langdon. Lactation Consultation Note   Encouraged mother to breastfeed first, then supplement with formula if desired. Suggest breastfeeing baby 8-12 times/24 hours and with feeding cues to establish her milk supply. Reviewed cluster feeding, supply, demand, comfort gel use and engorgement care.   Patient Name: Hannah Bishop [id number]'s Date: 01/16/2014 Reason for consult: Follow-up assessment   Maternal Data    Feeding Feeding Type: Breast Fed Length of feed: 25 min  LATCH Score/Interventions Latch: Repeated attempts needed to sustain latch, nipple held in mouth throughout feeding, stimulation needed to elicit sucking reflex. Intervention(s): Breast massage;Breast compression  Audible Swallowing: A few with stimulation Intervention(s): Hand expression  Type of Nipple: Everted at rest and after stimulation  Comfort (Breast/Nipple): Soft / non-tender     Hold (Positioning): No assistance needed to correctly position infant at breast.  LATCH Score: 8  Lactation Tools Discussed/Used     Consult Status Consult Status: Complete    Hardie PulleyBerkelhammer, Ruth Boschen 01/16/2014, 10:30 AM

## 2014-02-16 ENCOUNTER — Emergency Department (HOSPITAL_COMMUNITY): Payer: BC Managed Care – PPO

## 2014-02-16 ENCOUNTER — Inpatient Hospital Stay (HOSPITAL_COMMUNITY)
Admission: EM | Admit: 2014-02-16 | Discharge: 2014-02-17 | DRG: 776 | Disposition: A | Discharge status: Home / Self Care | Payer: BC Managed Care – PPO | Attending: Obstetrics & Gynecology | Admitting: Obstetrics & Gynecology

## 2014-02-16 ENCOUNTER — Encounter (HOSPITAL_COMMUNITY): Payer: Self-pay | Admitting: Emergency Medicine

## 2014-02-16 ENCOUNTER — Other Ambulatory Visit: Payer: Self-pay | Admitting: Obstetrics and Gynecology

## 2014-02-16 ENCOUNTER — Emergency Department (HOSPITAL_COMMUNITY)
Admission: EM | Admit: 2014-02-16 | Discharge: 2014-02-16 | Disposition: A | Discharge status: Home / Self Care | Payer: BC Managed Care – PPO | Source: Home / Self Care | Attending: Family Medicine | Admitting: Family Medicine

## 2014-02-16 DIAGNOSIS — R509 Fever, unspecified: Secondary | ICD-10-CM

## 2014-02-16 DIAGNOSIS — N719 Inflammatory disease of uterus, unspecified: Secondary | ICD-10-CM

## 2014-02-16 DIAGNOSIS — E876 Hypokalemia: Secondary | ICD-10-CM | POA: Diagnosis present

## 2014-02-16 DIAGNOSIS — A419 Sepsis, unspecified organism: Secondary | ICD-10-CM | POA: Diagnosis present

## 2014-02-16 DIAGNOSIS — O9123 Nonpurulent mastitis associated with lactation: Principal | ICD-10-CM | POA: Diagnosis present

## 2014-02-16 DIAGNOSIS — O8612 Endometritis following delivery: Secondary | ICD-10-CM | POA: Diagnosis present

## 2014-02-16 DIAGNOSIS — Z888 Allergy status to other drugs, medicaments and biological substances status: Secondary | ICD-10-CM | POA: Diagnosis not present

## 2014-02-16 DIAGNOSIS — N949 Unspecified condition associated with female genital organs and menstrual cycle: Secondary | ICD-10-CM

## 2014-02-16 DIAGNOSIS — R6510 Systemic inflammatory response syndrome (SIRS) of non-infectious origin without acute organ dysfunction: Secondary | ICD-10-CM

## 2014-02-16 DIAGNOSIS — E860 Dehydration: Secondary | ICD-10-CM | POA: Diagnosis present

## 2014-02-16 LAB — URINE MICROSCOPIC-ADD ON

## 2014-02-16 LAB — COMPREHENSIVE METABOLIC PANEL
ALT: 18 U/L (ref 0–35)
AST: 21 U/L (ref 0–37)
Albumin: 3.3 g/dL — ABNORMAL LOW (ref 3.5–5.2)
Alkaline Phosphatase: 74 U/L (ref 39–117)
Anion gap: 13 (ref 5–15)
BILIRUBIN TOTAL: 0.6 mg/dL (ref 0.3–1.2)
BUN: 8 mg/dL (ref 6–23)
CALCIUM: 9 mg/dL (ref 8.4–10.5)
CHLORIDE: 104 meq/L (ref 96–112)
CO2: 22 mmol/L (ref 19–32)
Creatinine, Ser: 1.08 mg/dL (ref 0.50–1.10)
GFR calc Af Amer: 83 mL/min — ABNORMAL LOW (ref >90)
GFR calc non Af Amer: 72 mL/min — ABNORMAL LOW (ref >90)
Glucose, Bld: 93 mg/dL (ref 70–99)
POTASSIUM: 3.1 mmol/L — AB (ref 3.5–5.1)
Sodium: 139 mmol/L (ref 135–145)
Total Protein: 6.9 g/dL (ref 6.0–8.3)

## 2014-02-16 LAB — CBC WITH DIFFERENTIAL/PLATELET
Basophils Absolute: 0 10*3/uL (ref 0.0–0.1)
Basophils Relative: 0 % (ref 0–1)
Eosinophils Absolute: 0 10*3/uL (ref 0.0–0.7)
Eosinophils Relative: 0 % (ref 0–5)
HEMATOCRIT: 34.9 % — AB (ref 36.0–46.0)
Hemoglobin: 10.7 g/dL — ABNORMAL LOW (ref 12.0–15.0)
Lymphocytes Relative: 7 % — ABNORMAL LOW (ref 12–46)
Lymphs Abs: 1.8 10*3/uL (ref 0.7–4.0)
MCH: 26 pg (ref 26.0–34.0)
MCHC: 30.7 g/dL (ref 30.0–36.0)
MCV: 84.7 fL (ref 78.0–100.0)
Monocytes Absolute: 1.5 10*3/uL — ABNORMAL HIGH (ref 0.1–1.0)
Monocytes Relative: 6 % (ref 3–12)
Neutro Abs: 22.2 10*3/uL — ABNORMAL HIGH (ref 1.7–7.7)
Neutrophils Relative %: 87 % — ABNORMAL HIGH (ref 43–77)
PLATELETS: 243 10*3/uL (ref 150–400)
RBC: 4.12 MIL/uL (ref 3.87–5.11)
RDW: 14.3 % (ref 11.5–15.5)
Smear Review: ADEQUATE
WBC: 25.5 10*3/uL — AB (ref 4.0–10.5)

## 2014-02-16 LAB — URINALYSIS, ROUTINE W REFLEX MICROSCOPIC
Bilirubin Urine: NEGATIVE
GLUCOSE, UA: NEGATIVE mg/dL
Ketones, ur: 15 mg/dL — AB
Nitrite: NEGATIVE
PROTEIN: 30 mg/dL — AB
Specific Gravity, Urine: 1.029 (ref 1.005–1.030)
UROBILINOGEN UA: 0.2 mg/dL (ref 0.0–1.0)
pH: 7 (ref 5.0–8.0)

## 2014-02-16 LAB — WET PREP, GENITAL
Clue Cells Wet Prep HPF POC: NONE SEEN
Trich, Wet Prep: NONE SEEN
YEAST WET PREP: NONE SEEN

## 2014-02-16 LAB — I-STAT CG4 LACTIC ACID, ED: Lactic Acid, Venous: 1.53 mmol/L (ref 0.5–2.2)

## 2014-02-16 MED ORDER — DOXYCYCLINE HYCLATE 100 MG PO TABS
100.0000 mg | ORAL_TABLET | Freq: Once | ORAL | Status: AC
  Administered 2014-02-16: 100 mg via ORAL
  Filled 2014-02-16: qty 1

## 2014-02-16 MED ORDER — ACETAMINOPHEN 325 MG PO TABS
650.0000 mg | ORAL_TABLET | Freq: Once | ORAL | Status: AC
  Administered 2014-02-16: 650 mg via ORAL
  Filled 2014-02-16: qty 2

## 2014-02-16 MED ORDER — ACETAMINOPHEN 325 MG PO TABS
650.0000 mg | ORAL_TABLET | Freq: Four times a day (QID) | ORAL | Status: DC | PRN
  Administered 2014-02-16 – 2014-02-17 (×2): 650 mg via ORAL
  Filled 2014-02-16 (×2): qty 2

## 2014-02-16 MED ORDER — CEFTRIAXONE SODIUM 250 MG IJ SOLR: 250.0000 mg | INTRAMUSCULAR | Status: DC

## 2014-02-16 MED ORDER — SODIUM CHLORIDE 0.9 % IV BOLUS (SEPSIS)
1000.0000 mL | Freq: Once | INTRAVENOUS | Status: AC
  Administered 2014-02-16: 1000 mL via INTRAVENOUS

## 2014-02-16 MED ORDER — METRONIDAZOLE IN NACL 5-0.79 MG/ML-% IV SOLN
500.0000 mg | Freq: Once | INTRAVENOUS | Status: AC
  Administered 2014-02-16: 500 mg via INTRAVENOUS
  Filled 2014-02-16: qty 100

## 2014-02-16 MED ORDER — DEXTROSE 5 % IV SOLN
1.0000 g | Freq: Once | INTRAVENOUS | Status: AC
  Administered 2014-02-16: 1 g via INTRAVENOUS
  Filled 2014-02-16: qty 10

## 2014-02-16 NOTE — ED Notes (Signed)
Pt c/o chills and headache onset last night. Pt has temp of 103.2 in triage.

## 2014-02-16 NOTE — ED Notes (Signed)
Phlebotemy at bedside.

## 2014-02-16 NOTE — ED Provider Notes (Signed)
Rahma Wanner is a 24 y.o. female who presents to Urgent Care today for fever, chills, headache and body aches. Symptoms started yesterday evening. Headache is severe. Patient has a one-month-old at home and she is currently breast-feeding. No urinary symptoms vomiting or diarrhea. No neck stiffness.   Past Medical History  Diagnosis Date  . Medical history non-contributory    Past Surgical History  Procedure Laterality Date  . No past surgeries     History  Substance Use Topics  . Smoking status: Never Smoker   . Smokeless tobacco: Not on file  . Alcohol Use: No   ROS as above Medications: No current facility-administered medications for this encounter.   Current Outpatient Prescriptions  Medication Sig Dispense Refill  . ferrous sulfate 325 (65 FE) MG EC tablet Take 1 tablet (325 mg total) by mouth 2 (two) times daily. 60 tablet 3  . ibuprofen (ADVIL,MOTRIN) 600 MG tablet Take 1 tablet (600 mg total) by mouth every 6 (six) hours. 30 tablet 0  . oxyCODONE-acetaminophen (PERCOCET/ROXICET) 5-325 MG per tablet Take 1 tablet by mouth every 4 (four) hours as needed (for pain scale less than 7). 30 tablet 0  . Prenatal Vit-Fe Fumarate-FA (PRENATAL MULTIVITAMIN) TABS tablet Take 1 tablet by mouth daily at 12 noon.     Allergies  Allergen Reactions  . Codeine Nausea And Vomiting     Exam:  BP 97/53 mmHg  Pulse 131  Temp(Src) 102.9 F (39.4 C) (Oral)  Resp 20  SpO2 97% Gen:  fatigued appearing HEENT: EOMI,  MMM Lungs: Normal work of breathing. CTABL Heart: Tachycardia but regular no MRG Abd: NABS, Soft. Nondistended, Nontender Exts: Brisk capillary refill, warm and well perfused.  Neck: Negative Kernig's no meningismus  No results found for this or any previous visit (from the past 24 hour(s)). No results found.  Assessment and Plan: 24 y.o. female with fever tachycardia hypotension. Concerning for SIRS. Influenza is a possibility. Transfer to ED for further evaluation and  management.  Discussed warning signs or symptoms. Please see discharge instructions. Patient expresses understanding.     Rodolph Bong, MD 02/16/14 [phone]

## 2014-02-16 NOTE — ED Notes (Signed)
Pt pumping at present.

## 2014-02-16 NOTE — ED Provider Notes (Signed)
CSN: [id number]     Arrival date & time 02/16/14  1352 History   First MD Initiated Contact with Patient 02/16/14 1642     Chief Complaint  Patient presents with  . Chills  . Headache     (Consider location/radiation/quality/duration/timing/severity/associated sxs/prior Treatment) Patient is a 24 y.o. female presenting with headaches. The history is provided by the patient.  Headache Pain location:  Frontal Quality:  Sharp Radiates to:  Does not radiate Severity currently:  6/10 Severity at highest:  10/10 Onset quality:  Gradual Duration:  12 hours Timing:  Constant Progression:  Partially resolved (s/p tylenol at urgent care) Chronicity:  New Relieved by:  Acetaminophen Worsened by:  Nothing tried Ineffective treatments:  None tried Associated symptoms: fever and myalgias   Associated symptoms: no abdominal pain, no back pain, no cough, no dizziness, no pain, no fatigue, no nausea, no neck stiffness, no paresthesias, no photophobia, no sore throat, no vomiting and no weakness     Past Medical History  Diagnosis Date  . Medical history non-contributory    Past Surgical History  Procedure Laterality Date  . No past surgeries     No family history on file. History  Substance Use Topics  . Smoking status: Never Smoker   . Smokeless tobacco: Not on file  . Alcohol Use: No   OB History    Gravida Para Term Preterm AB TAB SAB Ectopic Multiple Living   0 1     Review of Systems  Constitutional: Positive for fever and chills. Negative for diaphoresis, activity change, appetite change and fatigue.  HENT: Negative for facial swelling, rhinorrhea, sore throat, trouble swallowing and voice change.   Eyes: Negative for photophobia, pain and visual disturbance.  Respiratory: Negative for cough, shortness of breath, wheezing and stridor.   Cardiovascular: Negative for chest pain, palpitations and leg swelling.  Gastrointestinal: Negative for nausea, vomiting,  abdominal pain, constipation and anal bleeding.  Endocrine: Negative.   Genitourinary: Positive for vaginal discharge. Negative for dysuria, vaginal bleeding and vaginal pain.  Musculoskeletal: Positive for myalgias. Negative for back pain, arthralgias and neck stiffness.  Skin: Negative.  Negative for rash.  Allergic/Immunologic: Negative.   Neurological: Positive for headaches. Negative for dizziness, tremors, syncope, weakness and paresthesias.  Psychiatric/Behavioral: Negative for suicidal ideas, sleep disturbance and self-injury.  All other systems reviewed and are negative.     Allergies  Codeine  Home Medications   Prior to Admission medications   Medication Sig Start Date End Date Taking? Authorizing Provider  ferrous sulfate 325 (65 FE) MG EC tablet Take 1 tablet (325 mg total) by mouth 2 (two) times daily. 01/16/14 01/16/15 Yes Venus Standard, CNM  ibuprofen (ADVIL,MOTRIN) 600 MG tablet Take 1 tablet (600 mg total) by mouth every 6 (six) hours. 01/16/14  Yes Venus Standard, CNM  oxyCODONE-acetaminophen (PERCOCET/ROXICET) 5-325 MG per tablet Take 1 tablet by mouth every 4 (four) hours as needed (for pain scale less than 7). 01/16/14  Yes Venus Standard, CNM  Prenatal Vit-Fe Fumarate-FA (PRENATAL MULTIVITAMIN) TABS tablet Take 1 tablet by mouth daily at 12 noon.   Yes Historical Provider, MD   BP 108/83 mmHg  Pulse 113  Temp(Src) 100.2 F (37.9 C) (Oral)  Resp 31  Ht  (1.6 m)  Wt 190 lb (86.183 kg)  BMI 33.67 kg/m2  SpO2 98%  LMP 04/06/2013  Breastfeeding? Yes Physical Exam  Constitutional: She is oriented to person, place, and time. She appears  well-developed and well-nourished. No distress.  HENT:  Head: Normocephalic and atraumatic.  Right Ear: External ear normal.  Left Ear: External ear normal.  Mouth/Throat: Oropharynx is clear and moist. No oropharyngeal exudate.  Eyes: Conjunctivae and EOM are normal. Pupils are equal, round, and reactive to light. No  scleral icterus.  Neck: Normal range of motion. Neck supple. No JVD present. No tracheal deviation present. No thyromegaly present.  Cardiovascular: Normal rate, regular rhythm and intact distal pulses.  Exam reveals no gallop and no friction rub.   No murmur heard. Pulmonary/Chest: Effort normal and breath sounds normal. No respiratory distress. She has no wheezes. She has no rales.  Abdominal: Soft. Bowel sounds are normal. She exhibits no distension. There is no tenderness.  Genitourinary: Pelvic exam was performed with patient supine. Cervix exhibits motion tenderness. Right adnexum displays no mass, no tenderness and no fullness. Left adnexum displays no mass, no tenderness and no fullness. No bleeding in the vagina. No foreign body around the vagina. Vaginal discharge (yellowish green discharge) found.  Musculoskeletal: Normal range of motion. She exhibits no edema or tenderness.  Neurological: She is alert and oriented to person, place, and time. No cranial nerve deficit. She exhibits normal muscle tone. Coordination normal.  5/5 strength in all 4 extremities. Sensation intact and normal in all 4 extremities. Normal gait. Normal finger to nose and heel to shin. Negative romberg.   Skin: Skin is warm and dry. She is not diaphoretic. No pallor.  Psychiatric: She has a normal mood and affect. She expresses no homicidal and no suicidal ideation. She expresses no suicidal plans and no homicidal plans.  Nursing note and vitals reviewed.   ED Course  Procedures (including critical care time) Labs Review Labs Reviewed  WET PREP, GENITAL - Abnormal; Notable for the following:    WBC, Wet Prep HPF POC MANY (*)    All other components within normal limits  CBC WITH DIFFERENTIAL - Abnormal; Notable for the following:    WBC 25.5 (*)    Hemoglobin 10.7 (*)    HCT 34.9 (*)    Neutrophils Relative % 87 (*)    Lymphocytes Relative 7 (*)    Neutro Abs 22.2 (*)    Monocytes Absolute 1.5 (*)    All  other components within normal limits  COMPREHENSIVE METABOLIC PANEL - Abnormal; Notable for the following:    Potassium 3.1 (*)    Albumin 3.3 (*)    GFR calc non Af Amer 72 (*)    GFR calc Af Amer 83 (*)    All other components within normal limits  URINALYSIS, ROUTINE W REFLEX MICROSCOPIC - Abnormal; Notable for the following:    APPearance HAZY (*)    Hgb urine dipstick TRACE (*)    Ketones, ur 15 (*)    Protein, ur 30 (*)    Leukocytes, UA SMALL (*)    All other components within normal limits  URINE MICROSCOPIC-ADD ON - Abnormal; Notable for the following:    Squamous Epithelial / LPF MANY (*)    Bacteria, UA FEW (*)    All other components within normal limits  URINE CULTURE  CULTURE, BLOOD (ROUTINE X 2)  CULTURE, BLOOD (ROUTINE X 2)  GC/CHLAMYDIA PROBE AMP  INFLUENZA PANEL BY PCR (TYPE A & B, H1N1)  HIV ANTIBODY (ROUTINE TESTING)  RPR  I-STAT CG4 LACTIC ACID, ED    Imaging Review Dg Chest 2 View  02/16/2014   CLINICAL DATA:  Fever.  EXAM: CHEST  2 VIEW  COMPARISON:  None.  FINDINGS: The heart size and mediastinal contours are within normal limits. Both lungs are clear. No pneumothorax or pleural effusion is noted. The visualized skeletal structures are unremarkable.  IMPRESSION: No acute cardiopulmonary abnormality seen.   Electronically Signed   By: Roque Lias M.D.   On: 02/16/2014 15:55   US Pelvis Complete  02/16/2014   CLINICAL DATA:  Cervical motion tenderness. Four weeks postpartum. Fever and chills.  EXAM: TRANSABDOMINAL ULTRASOUND OF PELVIS  TECHNIQUE: Transabdominal ultrasound examination of the pelvis was performed including evaluation of the uterus, ovaries, adnexal regions, and pelvic cul-de-sac.  COMPARISON:  None.  FINDINGS: Uterus  Measurements: 9.6 x 5.1 x 7.6 cm. No fibroids or other mass visualized.  Endometrium  Thickness: 6 mm, within normal limits. No focal abnormality visualized.  Right ovary  Measurements: 4.0 x 1.7 x 3.4 cm, within normal limits.  Normal appearance/no adnexal mass.  Left ovary  Measurements: 3.0 x 3.2 x 1.9 cm, within normal limits. Normal appearance/no adnexal mass.  Other findings:  No free fluid  IMPRESSION: Normal sonographic appearance of the uterus and ovaries.   Electronically Signed   By: Gennette Pac M.D.   On: 02/16/2014 20:52     EKG Interpretation None      MDM   Final diagnoses:  CMT (cervical motion tenderness)  Sepsis, due to unspecified organism    The Patient is a 24 y.o. F who presents with 12 hours of fever, chills, myagias, frontal headache and vaginal discharge. Of note patient had a vaginal delivery one month prior to presentation. Patient vitals noted to be significant for tachycardia to 130 and fever to 103.2 and WBC elevated to 25.5. Pelvic exam shows greenish yellow discharge and CMT. Vaginal [place] does not show any acute significant findings but still have concern for endometritis given vitals, labs, and clinical presentation. Wide spectrum abx started and OBGYN consulted, [facility]. Patient expresses understanding and agreement with this plan.  Patient seen with attending, Dr. Jodi Mourning, who oversaw clinical decision making.   Lula Olszewski, MD 02/17/14 1610  Enid Skeens, MD 02/17/14 [phone]

## 2014-02-16 NOTE — ED Notes (Signed)
Resident and tech at bedside to complete pelvic exam

## 2014-02-16 NOTE — Discharge Instructions (Signed)
Thank you for coming in today. GO DIRECTLY TO THE EMERGENCY ROOM   Fever, Adult A fever is a higher than normal body temperature. In an adult, an oral temperature around 98.6 F (37 C) is considered normal. A temperature of 100.4 F (38 C) or higher is generally considered a fever. Mild or moderate fevers generally have no long-term effects and often do not require treatment. Extreme fever (greater than or equal to 106 F or 41.1 C) can cause seizures. The sweating that may occur with repeated or prolonged fever may cause dehydration. Elderly people can develop confusion during a fever. A measured temperature can vary with:  Age.  Time of day.  Method of measurement (mouth, underarm, rectal, or ear). The fever is confirmed by taking a temperature with a thermometer. Temperatures can be taken different ways. Some methods are accurate and some are not.  An oral temperature is used most commonly. Electronic thermometers are fast and accurate.  An ear temperature will only be accurate if the thermometer is positioned as recommended by the manufacturer.  A rectal temperature is accurate and done for those adults who have a condition where an oral temperature cannot be taken.  An underarm (axillary) temperature is not accurate and not recommended. Fever is a symptom, not a disease.  CAUSES   Infections commonly cause fever.  Some noninfectious causes for fever include:  Some arthritis conditions.  Some thyroid or adrenal gland conditions.  Some immune system conditions.  Some types of cancer.  A medicine reaction.  High doses of certain street drugs such as methamphetamine.  Dehydration.  Exposure to high outside or room temperatures.  Occasionally, the source of a fever cannot be determined. This is sometimes called a "fever of unknown origin" (FUO).  Some situations may lead to a temporary rise in body temperature that may go away on its own. Examples  are:  Childbirth.  Surgery.  Intense exercise. HOME CARE INSTRUCTIONS   Take appropriate medicines for fever. Follow dosing instructions carefully. If you use acetaminophen to reduce the fever, be careful to avoid taking other medicines that also contain acetaminophen. Do not take aspirin for a fever if you are younger than age 38. There is an association with Reye's syndrome. Reye's syndrome is a rare but potentially deadly disease.  If an infection is present and antibiotics have been prescribed, take them as directed. Finish them even if you start to feel better.  Rest as needed.  Maintain an adequate fluid intake. To prevent dehydration during an illness with prolonged or recurrent fever, you may need to drink extra fluid.Drink enough fluids to keep your urine clear or pale yellow.  Sponging or bathing with room temperature water may help reduce body temperature. Do not use ice water or alcohol sponge baths.  Dress comfortably, but do not over-bundle. SEEK MEDICAL CARE IF:   You are unable to keep fluids down.  You develop vomiting or diarrhea.  You are not feeling at least partly better after 3 days.  You develop new symptoms or problems. SEEK IMMEDIATE MEDICAL CARE IF:   You have shortness of breath or trouble breathing.  You develop excessive weakness.  You are dizzy or you faint.  You are extremely thirsty or you are making little or no urine.  You develop new pain that was not there before (such as in the head, neck, chest, back, or abdomen).  You have persistent vomiting and diarrhea for more than 1 to 2 days.  You develop  a stiff neck or your eyes become sensitive to light.  You develop a skin rash.  You have a fever or persistent symptoms for more than 2 to 3 days.  You have a fever and your symptoms suddenly get worse. MAKE SURE YOU:   Understand these instructions.  Will watch your condition.  Will get help right away if you are not doing well or  get worse. Document Released: 07/28/2000 Document Revised: 06/18/2013 Document Reviewed: 12/03/2010 [facility] Patient Information 2015 [place], [place]. This information is not intended to replace advice given to you by your health care provider. Make sure you discuss any questions you have with your health care provider.

## 2014-02-16 NOTE — ED Notes (Signed)
C/o cold sx onset last night Sx include: constant frontal HA, chills, nauseas Had tyle/ibup last night w/temp relief Alert, no signs of acute distress.

## 2014-02-16 NOTE — ED Notes (Signed)
Pt. Going to [facility]. 304. Report attempted

## 2014-02-17 ENCOUNTER — Encounter (HOSPITAL_COMMUNITY): Payer: Self-pay | Admitting: *Deleted

## 2014-02-17 LAB — CBC WITH DIFFERENTIAL/PLATELET
BASOS ABS: 0 10*3/uL (ref 0.0–0.1)
BASOS PCT: 0 % (ref 0–1)
Eosinophils Absolute: 0.8 10*3/uL — ABNORMAL HIGH (ref 0.0–0.7)
Eosinophils Relative: 4 % (ref 0–5)
HCT: 31.1 % — ABNORMAL LOW (ref 36.0–46.0)
HEMOGLOBIN: 9.9 g/dL — AB (ref 12.0–15.0)
Lymphocytes Relative: 12 % (ref 12–46)
Lymphs Abs: 2.2 10*3/uL (ref 0.7–4.0)
MCH: 27.3 pg (ref 26.0–34.0)
MCHC: 31.8 g/dL (ref 30.0–36.0)
MCV: 85.9 fL (ref 78.0–100.0)
MONOS PCT: 3 % (ref 3–12)
Monocytes Absolute: 0.6 10*3/uL (ref 0.1–1.0)
Neutro Abs: 15.1 10*3/uL — ABNORMAL HIGH (ref 1.7–7.7)
Neutrophils Relative %: 81 % — ABNORMAL HIGH (ref 43–77)
Platelets: 195 10*3/uL (ref 150–400)
RBC: 3.62 MIL/uL — AB (ref 3.87–5.11)
RDW: 14.3 % (ref 11.5–15.5)
WBC: 18.7 10*3/uL — AB (ref 4.0–10.5)

## 2014-02-17 LAB — RPR

## 2014-02-17 LAB — HIV ANTIBODY (ROUTINE TESTING W REFLEX): HIV 1&2 Ab, 4th Generation: NONREACTIVE

## 2014-02-17 LAB — INFLUENZA PANEL BY PCR (TYPE A & B)
H1N1 flu by pcr: NOT DETECTED
Influenza A By PCR: NEGATIVE
Influenza B By PCR: NEGATIVE

## 2014-02-17 MED ORDER — PIPERACILLIN-TAZOBACTAM 3.375 G IVPB 30 MIN: 3.3750 g | Freq: Three times a day (TID) | INTRAVENOUS | Status: DC

## 2014-02-17 MED ORDER — DICLOXACILLIN SODIUM 500 MG PO CAPS
500.0000 mg | ORAL_CAPSULE | Freq: Four times a day (QID) | ORAL | Status: DC
  Administered 2014-02-17 (×3): 500 mg via ORAL
  Filled 2014-02-17 (×6): qty 1

## 2014-02-17 MED ORDER — DICLOXACILLIN SODIUM 500 MG PO CAPS: 500.0000 mg | ORAL_CAPSULE | Freq: Four times a day (QID) | ORAL | Status: DC

## 2014-02-17 MED ORDER — IBUPROFEN 600 MG PO TABS
600.0000 mg | ORAL_TABLET | Freq: Four times a day (QID) | ORAL | Status: DC | PRN
  Administered 2014-02-17 (×2): 600 mg via ORAL
  Filled 2014-02-17 (×2): qty 1

## 2014-02-17 MED ORDER — HYDROCODONE-ACETAMINOPHEN 5-325 MG PO TABS: 1.0000 | ORAL_TABLET | ORAL | Status: DC | PRN

## 2014-02-17 MED ORDER — DICLOXACILLIN SODIUM 500 MG PO CAPS: 500.0000 mg | ORAL_CAPSULE | Freq: Four times a day (QID) | ORAL | Status: AC

## 2014-02-17 MED ORDER — KCL-LACTATED RINGERS 20 MEQ/L IV SOLN
INTRAVENOUS | Status: DC
  Administered 2014-02-17: 05:00:00 via INTRAVENOUS
  Filled 2014-02-17 (×4): qty 1000

## 2014-02-17 MED ORDER — FLUCONAZOLE 150 MG PO TABS: 150.0000 mg | ORAL_TABLET | Freq: Every day | ORAL | Status: AC

## 2014-02-17 MED ORDER — LACTATED RINGERS IV SOLN: INTRAVENOUS | Status: DC

## 2014-02-17 MED ORDER — PRENATAL MULTIVITAMIN CH
1.0000 | ORAL_TABLET | Freq: Every day | ORAL | Status: DC
  Administered 2014-02-17: 1 via ORAL
  Filled 2014-02-17: qty 1

## 2014-02-17 NOTE — Discharge Summary (Signed)
Hannah Bishop   Subjective: Post Partum week 5 Vaginal delivery, with masatitis of the right breast Patient up ad lib, denies syncope or dizziness. Reports consuming regular diet without issues and denies N/V  Objective: Temp:  [98.7 F (37.1 C)-103.1 F (39.5 C)] 98.7 F (37.1 C) (01/03 1400) Pulse Rate:  [88-132] 88 (01/03 1400) Resp:  [15-36] 16 (01/03 1400) BP: (91-119)/(42-83) 112/64 mmHg (01/03 1400) SpO2:  [96 %-100 %] 99 % (01/03 1400) Weight:  [195 lb 8 oz (88.678 kg)] 195 lb 8 oz (88.678 kg) (01/03 0224)   Discharge Physical Exam:  By Dr Margie Billet General: alert and cooperative Ext: WNL, no edema. No evidence of DVT seen on physical exam. Breast: Soft  Pumping  Abdomen:  Soft, fundus firm,non distended, non tender Right vaginal cyst     Recent Labs  02/16/14 1702 02/17/14 1632  HGB 10.7* 9.9*  HCT 34.9* 31.1*   Recent Results (from the past 2160 hour(s))  OB RESULTS CONSOLE GC/Chlamydia     Status: None   Collection Time: 12/21/13 12:00 AM  Result Value Ref Range   Gonorrhea Negative    Chlamydia Negative   OB RESULT CONSOLE Group B Strep     Status: None   Collection Time: 12/24/13 12:00 AM  Result Value Ref Range   GBS Negative   HIV antibody     Status: None   Collection Time: 01/13/14  9:45 PM  Result Value Ref Range   HIV 1&2 Ab, 4th Generation NONREACTIVE NONREACTIVE    Comment: (NOTE) A NONREACTIVE HIV Ag/Ab result does not exclude HIV infection since the time frame for seroconversion is variable. If acute HIV infection is suspected, a HIV-1 RNA Qualitative TMA test is recommended. HIV-1/2 Antibody Diff         Not indicated. HIV-1 RNA, Qual TMA           Not indicated. PLEASE NOTE: This information has been disclosed to you from records whose confidentiality may be protected by state law. If your state requires such protection, then the state law prohibits you from making any further disclosure of the information without the specific  written consent of the person to whom it pertains, or as otherwise permitted by law. A general authorization for the release of medical or other information is NOT sufficient for this purpose. The performance of this assay has not been clinically validated in patients less than 75 years old. Performed at [facility]   CBC     Status: Abnormal   Collection Time: 01/13/14  9:45 PM  Result Value Ref Range   WBC 11.6 (H) 4.0 - 10.5 K/uL   RBC 4.09 3.87 - 5.11 MIL/uL   Hemoglobin 11.0 (L) 12.0 - 15.0 g/dL   HCT 33.7 (L) 36.0 - 46.0 %   MCV 82.4 78.0 - 100.0 fL   MCH 26.9 26.0 - 34.0 pg   MCHC 32.6 30.0 - 36.0 g/dL   RDW 14.2 11.5 - 15.5 %   Platelets 243 150 - 400 K/uL  RPR     Status: None   Collection Time: 01/13/14  9:45 PM  Result Value Ref Range   RPR NON REAC NON REAC    Comment: Performed at [facility]  CBC     Status: Abnormal   Collection Time: 01/15/14  6:20 AM  Result Value Ref Range   WBC 11.5 (H) 4.0 - 10.5 K/uL   RBC 3.85 (L) 3.87 - 5.11 MIL/uL   Hemoglobin 10.3 (L) 12.0 -  15.0 g/dL   HCT 32.8 (L) 36.0 - 46.0 %   MCV 85.2 78.0 - 100.0 fL   MCH 26.8 26.0 - 34.0 pg   MCHC 31.4 30.0 - 36.0 g/dL   RDW 14.8 11.5 - 15.5 %   Platelets 252 150 - 400 K/uL  CBC WITH DIFFERENTIAL     Status: Abnormal   Collection Time: 02/16/14  5:02 PM  Result Value Ref Range   WBC 25.5 (H) 4.0 - 10.5 K/uL   RBC 4.12 3.87 - 5.11 MIL/uL   Hemoglobin 10.7 (L) 12.0 - 15.0 g/dL   HCT 34.9 (L) 36.0 - 46.0 %   MCV 84.7 78.0 - 100.0 fL   MCH 26.0 26.0 - 34.0 pg   MCHC 30.7 30.0 - 36.0 g/dL   RDW 14.3 11.5 - 15.5 %   Platelets 243 150 - 400 K/uL   Neutrophils Relative % 87 (H) 43 - 77 %   Lymphocytes Relative 7 (L) 12 - 46 %   Monocytes Relative 6 3 - 12 %   Eosinophils Relative 0 0 - 5 %   Basophils Relative 0 0 - 1 %   Neutro Abs 22.2 (H) 1.7 - 7.7 K/uL   Lymphs Abs 1.8 0.7 - 4.0 K/uL   Monocytes Absolute 1.5 (H) 0.1 - 1.0 K/uL   Eosinophils Absolute 0.0 0.0 - 0.7  K/uL   Basophils Absolute 0.0 0.0 - 0.1 K/uL   Smear Review PLATELETS APPEAR ADEQUATE   Comprehensive metabolic panel     Status: Abnormal   Collection Time: 02/16/14  5:02 PM  Result Value Ref Range   Sodium 139 135 - 145 mmol/L    Comment: Please note change in reference range.   Potassium 3.1 (L) 3.5 - 5.1 mmol/L    Comment: Please note change in reference range.   Chloride 104 96 - 112 mEq/L   CO2 22 19 - 32 mmol/L   Glucose, Bld 93 70 - 99 mg/dL   BUN 8 6 - 23 mg/dL   Creatinine, Ser 1.08 0.50 - 1.10 mg/dL   Calcium 9.0 8.4 - 10.5 mg/dL   Total Protein 6.9 6.0 - 8.3 g/dL   Albumin 3.3 (L) 3.5 - 5.2 g/dL   AST 21 0 - 37 U/L   ALT 18 0 - 35 U/L   Alkaline Phosphatase 74 39 - 117 U/L   Total Bilirubin 0.6 0.3 - 1.2 mg/dL   GFR calc non Af Amer 72 (L) >90 mL/min   GFR calc Af Amer 83 (L) >90 mL/min    Comment: (NOTE) The eGFR has been calculated using the CKD EPI equation. This calculation has not been validated in all clinical situations. eGFR's persistently <90 mL/min signify possible Chronic Kidney Disease.    Anion gap 13 5 - 15  I-Stat CG4 Lactic Acid, ED     Status: None   Collection Time: 02/16/14  5:26 PM  Result Value Ref Range   Lactic Acid, Venous 1.53 0.5 - 2.2 mmol/L  Urinalysis with microscopic     Status: Abnormal   Collection Time: 02/16/14  5:36 PM  Result Value Ref Range   Color, Urine YELLOW YELLOW   APPearance HAZY (A) CLEAR   Specific Gravity, Urine 1.029 1.005 - 1.030   pH 7.0 5.0 - 8.0   Glucose, UA NEGATIVE NEGATIVE mg/dL   Hgb urine dipstick TRACE (A) NEGATIVE   Bilirubin Urine NEGATIVE NEGATIVE   Ketones, ur 15 (A) NEGATIVE mg/dL   Protein, ur 30 (A)  NEGATIVE mg/dL   Urobilinogen, UA 0.2 0.0 - 1.0 mg/dL   Nitrite NEGATIVE NEGATIVE   Leukocytes, UA SMALL (A) NEGATIVE  Urine microscopic-add on     Status: Abnormal   Collection Time: 02/16/14  5:36 PM  Result Value Ref Range   Squamous Epithelial / LPF MANY (A) RARE   WBC, UA 3-6 <3  WBC/hpf   RBC / HPF 0-2 <3 RBC/hpf   Bacteria, UA FEW (A) RARE  HIV antibody     Status: None   Collection Time: 02/16/14  6:47 PM  Result Value Ref Range   HIV 1&2 Ab, 4th Generation NONREACTIVE NONREACTIVE    Comment: (NOTE) A NONREACTIVE HIV Ag/Ab result does not exclude HIV infection since the time frame for seroconversion is variable. If acute HIV infection is suspected, a HIV-1 RNA Qualitative TMA test is recommended. HIV-1/2 Antibody Diff         Not indicated. HIV-1 RNA, Qual TMA           Not indicated. PLEASE NOTE: This information has been disclosed to you from records whose confidentiality may be protected by state law. If your state requires such protection, then the state law prohibits you from making any further disclosure of the information without the specific written consent of the person to whom it pertains, or as otherwise permitted by law. A general authorization for the release of medical or other information is NOT sufficient for this purpose. The performance of this assay has not been clinically validated in patients less than 52 years old. Performed at [facility]   RPR     Status: None   Collection Time: 02/16/14  6:47 PM  Result Value Ref Range   RPR NON REAC NON REAC    Comment: Performed at [facility] prep, genital     Status: Abnormal   Collection Time: 02/16/14  6:51 PM  Result Value Ref Range   Yeast Wet Prep HPF POC NONE SEEN NONE SEEN   Trich, Wet Prep NONE SEEN NONE SEEN   Clue Cells Wet Prep HPF POC NONE SEEN NONE SEEN   WBC, Wet Prep HPF POC MANY (A) NONE SEEN  Influenza panel by pcr     Status: None   Collection Time: 02/16/14  6:59 PM  Result Value Ref Range   Influenza A By PCR NEGATIVE NEGATIVE   Influenza B By PCR NEGATIVE NEGATIVE   H1N1 flu by pcr NOT DETECTED NOT DETECTED    Comment:        The Xpert Flu assay (FDA approved for nasal aspirates or washes and nasopharyngeal swab specimens), is intended as  an aid in the diagnosis of influenza and should not be used as a sole basis for treatment.   CBC with Differential     Status: Abnormal   Collection Time: 02/17/14  4:32 PM  Result Value Ref Range   WBC 18.7 (H) 4.0 - 10.5 K/uL   RBC 3.62 (L) 3.87 - 5.11 MIL/uL   Hemoglobin 9.9 (L) 12.0 - 15.0 g/dL   HCT 31.1 (L) 36.0 - 46.0 %   MCV 85.9 78.0 - 100.0 fL   MCH 27.3 26.0 - 34.0 pg   MCHC 31.8 30.0 - 36.0 g/dL   RDW 14.3 11.5 - 15.5 %   Platelets 195 150 - 400 K/uL   Neutrophils Relative % 81 (H) 43 - 77 %   Neutro Abs 15.1 (H) 1.7 - 7.7 K/uL   Lymphocytes Relative 12 12 -  46 %   Lymphs Abs 2.2 0.7 - 4.0 K/uL   Monocytes Relative 3 3 - 12 %   Monocytes Absolute 0.6 0.1 - 1.0 K/uL   Eosinophils Relative 4 0 - 5 %   Eosinophils Absolute 0.8 (H) 0.0 - 0.7 K/uL   Basophils Relative 0 0 - 1 %   Basophils Absolute 0.0 0.0 - 0.1 K/uL   Assessment S/P Vaginal Delivery-week 5 Stable  Normal Involution Breast pumping Assessment by Dr Margie Billet  Plan: Continue current care Pt DC to home in stable contition Discharge home and Lactation consult  Pt to keep her 02/20/14 [facility] appointment    Gianna Calef, CNM, MSN 02/17/2014, 6:00 PM

## 2014-02-17 NOTE — H&P (Signed)
Hannah Bishop is an 24 y.o. female who presents 5 weeks postpartum with complaint of fever, chills, headache, and body aches.  Patient currently breastfeeding and presented to ER prior to transfer to [facility]  During ER visit MD exam positive for yellowish green discharge and patient suspected to have endometritis.  Patient transferred into care of Faculty practice before staff discovered that she is a [facility] patient.  Upon Dr. Carroll Sage examination, patient negative for endometritis and positive for breast engorgement.  Patient diagnosed with mastitis and treated accordingly.   Pertinent Gynecological History: Menses: LMP 04/06/2013 Bleeding: Postpartum Lochia Contraception: Pelvic Rest Sexually transmitted diseases: no past history Previous GYN Procedures: N/A  Last pap: normal Date: 09/13/2012 OB History: G1, P1001 SVD on 01/14/2014 by V. Standard   Menstrual History: Patient's last menstrual period was 04/06/2013.    Past Medical History  Diagnosis Date  . Medical history non-contributory     Past Surgical History  Procedure Laterality Date  . No past surgeries      History reviewed. No pertinent family history.  Social History:  reports that she has never smoked. She does not have any smokeless tobacco history on file. She reports that she does not drink alcohol or use illicit drugs.  Allergies:  Allergies  Allergen Reactions  . Codeine Nausea And Vomiting    Prescriptions prior to admission  Medication Sig Dispense Refill Last Dose  . ferrous sulfate 325 (65 FE) MG EC tablet Take 1 tablet (325 mg total) by mouth 2 (two) times daily. 60 tablet 3 Past Month at Unknown time  . ibuprofen (ADVIL,MOTRIN) 600 MG tablet Take 1 tablet (600 mg total) by mouth every 6 (six) hours. 30 tablet 0 Past Month at Unknown time  . oxyCODONE-acetaminophen (PERCOCET/ROXICET) 5-325 MG per tablet Take 1 tablet by mouth every 4 (four) hours as needed (for pain scale less than 7). 30 tablet 0 Past Month  at Unknown time  . Prenatal Vit-Fe Fumarate-FA (PRENATAL MULTIVITAMIN) TABS tablet Take 1 tablet by mouth daily at 12 noon.   02/16/2014 at Unknown time    Review of Systems  Constitutional: Positive for fever and chills.  Cardiovascular:       Breast Pain  Gastrointestinal: Negative for nausea, vomiting and abdominal pain.  Genitourinary: Negative for dysuria.       Foul Vaginal Discharge  All other systems reviewed and are negative.  Blood pressure 115/65, pulse 98, temperature 99.7 F (37.6 C), temperature source Oral, resp. rate 20, height  (1.6 m), weight 195 lb 8 oz (88.678 kg), last menstrual period 04/06/2013, SpO2 99 %, currently breastfeeding.   Physical Exam  Constitutional: She is oriented to person, place, and time. She appears well-developed and well-nourished.  Patient does not appear septic.  HENT:  Head: Normocephalic and atraumatic.  Eyes: EOM are normal.  Neck: Normal range of motion.  Cardiovascular: Normal rate.   Respiratory: Effort normal.  Breast Engorgement noted  GI: Soft. Normal appearance. She exhibits no distension. There is no tenderness. There is no rebound and no guarding.  Genitourinary:  Deferred  Musculoskeletal: Normal range of motion.  SCDs on  Neurological: She is alert and oriented to person, place, and time.  Skin: Skin is warm and dry. She is not diaphoretic.  Psychiatric: She has a normal mood and affect.   Results for orders placed or performed during the hospital encounter of 02/16/14 (from the past 24 hour(s))  CBC WITH DIFFERENTIAL     Status: Abnormal  Collection Time: 02/16/14  5:02 PM  Result Value Ref Range   WBC 25.5 (H) 4.0 - 10.5 K/uL   RBC 4.12 3.87 - 5.11 MIL/uL   Hemoglobin 10.7 (L) 12.0 - 15.0 g/dL   HCT 16.1 (L) 09.6 - 04.5 %   MCV 84.7 78.0 - 100.0 fL   MCH 26.0 26.0 - 34.0 pg   MCHC 30.7 30.0 - 36.0 g/dL   RDW 40.9 81.1 - 91.4 %   Platelets 243 150 - 400 K/uL   Neutrophils Relative % 87 (H) 43 - 77 %    Lymphocytes Relative 7 (L) 12 - 46 %   Monocytes Relative 6 3 - 12 %   Eosinophils Relative 0 0 - 5 %   Basophils Relative 0 0 - 1 %   Neutro Abs 22.2 (H) 1.7 - 7.7 K/uL   Lymphs Abs 1.8 0.7 - 4.0 K/uL   Monocytes Absolute 1.5 (H) 0.1 - 1.0 K/uL   Eosinophils Absolute 0.0 0.0 - 0.7 K/uL   Basophils Absolute 0.0 0.0 - 0.1 K/uL   Smear Review PLATELETS APPEAR ADEQUATE   Comprehensive metabolic panel     Status: Abnormal   Collection Time: 02/16/14  5:02 PM  Result Value Ref Range   Sodium 139 135 - 145 mmol/L   Potassium 3.1 (L) 3.5 - 5.1 mmol/L   Chloride 104 96 - 112 mEq/L   CO2 22 19 - 32 mmol/L   Glucose, Bld 93 70 - 99 mg/dL   BUN 8 6 - 23 mg/dL   Creatinine, Ser 7.82 0.50 - 1.10 mg/dL   Calcium 9.0 8.4 - 95.6 mg/dL   Total Protein 6.9 6.0 - 8.3 g/dL   Albumin 3.3 (L) 3.5 - 5.2 g/dL   AST 21 0 - 37 U/L   ALT 18 0 - 35 U/L   Alkaline Phosphatase 74 39 - 117 U/L   Total Bilirubin 0.6 0.3 - 1.2 mg/dL   GFR calc non Af Amer 72 (L) >90 mL/min   GFR calc Af Amer 83 (L) >90 mL/min   Anion gap 13 5 - 15  I-Stat CG4 Lactic Acid, ED     Status: None   Collection Time: 02/16/14  5:26 PM  Result Value Ref Range   Lactic Acid, Venous 1.53 0.5 - 2.2 mmol/L  Urinalysis with microscopic     Status: Abnormal   Collection Time: 02/16/14  5:36 PM  Result Value Ref Range   Color, Urine YELLOW YELLOW   APPearance HAZY (A) CLEAR   Specific Gravity, Urine 1.029 1.005 - 1.030   pH 7.0 5.0 - 8.0   Glucose, UA NEGATIVE NEGATIVE mg/dL   Hgb urine dipstick TRACE (A) NEGATIVE   Bilirubin Urine NEGATIVE NEGATIVE   Ketones, ur 15 (A) NEGATIVE mg/dL   Protein, ur 30 (A) NEGATIVE mg/dL   Urobilinogen, UA 0.2 0.0 - 1.0 mg/dL   Nitrite NEGATIVE NEGATIVE   Leukocytes, UA SMALL (A) NEGATIVE  Urine microscopic-add on     Status: Abnormal   Collection Time: 02/16/14  5:36 PM  Result Value Ref Range   Squamous Epithelial / LPF MANY (A) RARE   WBC, UA 3-6 <3 WBC/hpf   RBC / HPF 0-2 <3 RBC/hpf    Bacteria, UA FEW (A) RARE  Wet prep, genital     Status: Abnormal   Collection Time: 02/16/14  6:51 PM  Result Value Ref Range   Yeast Wet Prep HPF POC NONE SEEN NONE SEEN   Trich, Wet Prep NONE  SEEN NONE SEEN   Clue Cells Wet Prep HPF POC NONE SEEN NONE SEEN   WBC, Wet Prep HPF POC MANY (A) NONE SEEN    Dg Chest 2 View  02/16/2014   CLINICAL DATA:  Fever.  EXAM: CHEST  2 VIEW  COMPARISON:  None.  FINDINGS: The heart size and mediastinal contours are within normal limits. Both lungs are clear. No pneumothorax or pleural effusion is noted. The visualized skeletal structures are unremarkable.  IMPRESSION: No acute cardiopulmonary abnormality seen.   Electronically Signed   By: Roque Lias M.D.   On: 02/16/2014 15:55   US Pelvis Complete  02/16/2014   CLINICAL DATA:  Cervical motion tenderness. Four weeks postpartum. Fever and chills.  EXAM: TRANSABDOMINAL ULTRASOUND OF PELVIS  TECHNIQUE: Transabdominal ultrasound examination of the pelvis was performed including evaluation of the uterus, ovaries, adnexal regions, and pelvic cul-de-sac.  COMPARISON:  None.  FINDINGS: Uterus  Measurements: 9.6 x 5.1 x 7.6 cm. No fibroids or other mass visualized.  Endometrium  Thickness: 6 mm, within normal limits. No focal abnormality visualized.  Right ovary  Measurements: 4.0 x 1.7 x 3.4 cm, within normal limits. Normal appearance/no adnexal mass.  Left ovary  Measurements: 3.0 x 3.2 x 1.9 cm, within normal limits. Normal appearance/no adnexal mass.  Other findings:  No free fluid  IMPRESSION: Normal sonographic appearance of the uterus and ovaries.   Electronically Signed   By: Gennette Pac M.D.   On: 02/16/2014 20:52    Assessment: Postpartum Week 5 Normal Involution Breastfeeding Mastitis  Plan: Admitted to women's unit by faculty practice Blood and Urine Culture Pending Discontinue anbx for endometritis Start Dicloxacillin Repeat CBC with Differential in 24 hours: 1700 on 02/17/2014  Hannah Endicott  Bishop 02/17/2014, 4:39 AM

## 2014-02-17 NOTE — Progress Notes (Signed)
In to assess patient for Mastitis and to perform vaginal exam.  CNM saw lesion in vagina this am.  Pt states she feels much better and is ready to go home.  S/p lactation consult.  Using ice packs and pumping. Breasts are no longer tender, decreased erythema.  Per RN, yellow stringy material is coming out of nipples.  Tc 98.7 Tmax (since admission) 100.9  Gen:  NAD, appears well Breast:  Erythema resolved on right breast, nontender,  Milk duct smaller.  Left breast normal. Abd:  Soft, nontender Pelvic:  Perineum well healed.  Scant yellow d/c and odor c/w lochia.  1 cm vaginal cyst on right upper 1/3 of vagina, nontender.  No CMT, uterus nontender.  WBC 25.5 to 18.7  A: Mastitis  -resolving with pumping, antibiotics, ice packs.  Fever -Defervescing.  Vaginal wall cyst, not infectious.  Hypokalemia-on IVF with KCL.  P:  Discharge home on 10 days of Dicloxicillin.  Cont to pump, use ice packs, massage.  [facility] to f/u on vaginal wall cyst at postpartum exam on 02/20/14.  KDur 20 meq q 6 hours x 24 hours.  CNM Venus Standard to discharge pt.

## 2014-02-17 NOTE — Progress Notes (Signed)
Mother w/ mastitis.  Discussed pumping, ice packs, engorgement. Mother states she thinks the problem started when she went approx 8 hours without pumping or breastfeeding. Mother has a great milk supply.  She pumps 6 oz in a session. Suggest she either pump or breastfeed 8-12 times a day. Encouraged her to put baby to the breast more often than pumping to regulate her milk supply. Discussed feeding strategies and prevention and early signs and symptoms.

## 2014-02-17 NOTE — Progress Notes (Signed)
Discharge instructions reviewed with patient.  Patient states understanding of home care, medications, activity, signs/symptoms to report to MD and return MD office visit.  Patients  family will assist with her care @ home.  No home equipment needed. Patient ambulated for discharge with personal belongings in stable condition with staff without incident.

## 2014-02-17 NOTE — Progress Notes (Signed)
Hannah Bishop   Subjective: Post Partum 5 weeks Vaginal delivery, readmitted with dx of mastitis  Patient up ad lib, denies syncope or dizziness. Report feeling better Breast pumping  Objective: Temp:  [99.1 F (37.3 C)-103.1 F (39.5 C)] 99.1 F (37.3 C) (01/03 1000) Pulse Rate:  [94-132] 94 (01/03 1000) Resp:  [15-36] 20 (01/03 1000) BP: (91-119)/(42-83) 109/49 mmHg (01/03 1000) SpO2:  [96 %-100 %] 96 % (01/03 1000) Weight:  [195 lb 8 oz (88.678 kg)] 195 lb 8 oz (88.678 kg) (01/03 0224)  Physical Exam:  General: alert and cooperative Ext: WNL, no edema. No evidence of DVT seen on physical exam. Breast: Soft  Lungs: CTAB Heart RRR without murmur Abdomen:  Soft,  + bowel sounds, non distended, non tender    Recent Labs  02/16/14 1702  HGB 10.7*  HCT 34.9*    Assessment S/P vaginal Delivery-5 weeks Stable  Normal Involution pumping   Plan: Continue current care Con't abx Urine and blood cultures pending CBC at 5pm     Nijah Tejera, CNM, MSN 02/17/2014, 2:33 PM

## 2014-02-17 NOTE — Progress Notes (Signed)
Hannah Bishop  Postpartum Week 5 LOS: 1  Subjective -Patient resting in bed.  Reports that vaginal discharge remains. Denies history of STD, recent sexual intercourse, and current BCM  Objective  Filed Vitals:   02/17/14 0224 02/17/14 0450 02/17/14 0554 02/17/14 0706  BP: 115/65  110/60   Pulse: 98  118   Temp: 99.7 F (37.6 C) 100.7 F (38.2 C) 100.9 F (38.3 C) 99.8 F (37.7 C)  TempSrc:  Oral Oral Oral  Resp: 20  20   Height:  (1.6 m)     Weight: 195 lb 8 oz (88.678 kg)     SpO2: 99%  97%     HEMOGLOBIN  Date/Time Value Ref Range Status  02/16/2014 05:02 PM 10.7* 12.0 - 15.0 g/dL Final  40/34/7425 95:63 AM 10.3* 12.0 - 15.0 g/dL Final   HCT  Date/Time Value Ref Range Status  02/16/2014 05:02 PM 34.9* 36.0 - 46.0 % Final  01/15/2014 06:20 AM 32.8* 36.0 - 46.0 % Final   PLATELETS  Date/Time Value Ref Range Status  02/16/2014 05:02 PM 243 150 - 400 K/uL Final  01/15/2014 06:20 AM 252 150 - 400 K/uL Final    Physical Exam  Constitutional: She is oriented to person, place, and time. She appears well-developed and well-nourished. No distress.  Cardiovascular: Normal rate and regular rhythm.   Pulmonary/Chest: Effort normal.  Abdominal: Soft. She exhibits no distension and no mass. There is no tenderness. There is no rebound and no guarding.  Fundus not palpated  Genitourinary: Cervix exhibits no motion tenderness and no discharge. Vaginal discharge found.  Creamy white discharge noted Sutures noted on left labia-healing well Sutures noted on right vaginal wall-healing well Able to palpate lima bean sized nodule on right vaginal wall anterior to cervix.  Neurological: She is alert and oriented to person, place, and time.  :    Assessment Postpartum Week 5 S/P SVD Normal Involution Breastfeeding Mastitis  Plan -PE as above -Discussed findings -Questions and concerns addressed -Patient encouraged to continue pumping -Repeat CBC this evening at  1700 -Continue to take dicoloxacillin -Will update Dr. Carroll Sage on patient status and exam findings  Elloise Roark, Joyice Faster, MSN, CNM 02/17/2014, 7:44 AM

## 2014-02-17 NOTE — Progress Notes (Signed)
1610 Patient c/o chills.Temp 100.7 Tylenol 650 mg po given.

## 2014-02-17 NOTE — Plan of Care (Signed)
Problem: Phase I Progression Outcomes Goal: Pain controlled with appropriate interventions Outcome: Completed/Met Date Met:  02/17/14 Denies need for pain med at this time. Goal: OOB as tolerated unless otherwise ordered Outcome: Completed/Met Date Met:  02/17/14 Can walk to bathroom without any problems. Goal: Initial discharge plan identified Outcome: Completed/Met Date Met:  02/17/14 Pain controlled Afebrile Able to pump Breasts without any problems Mastitis resolving

## 2014-02-17 NOTE — ED Notes (Signed)
carelink called  

## 2014-02-18 LAB — URINE CULTURE: Colony Count: 6000

## 2014-02-20 LAB — GC/CHLAMYDIA PROBE AMP
CT PROBE, AMP APTIMA: NEGATIVE
GC PROBE AMP APTIMA: NEGATIVE

## 2014-02-23 LAB — CULTURE, BLOOD (ROUTINE X 2)
CULTURE: NO GROWTH
Culture: NO GROWTH

## 2015-05-03 IMAGING — DX DG CHEST 2V
2 series · 2 of 2 positions shown · non-contrast
Comparison: None.

CLINICAL DATA: Fever.

EXAM:
CHEST  2 VIEW

[chest pa]
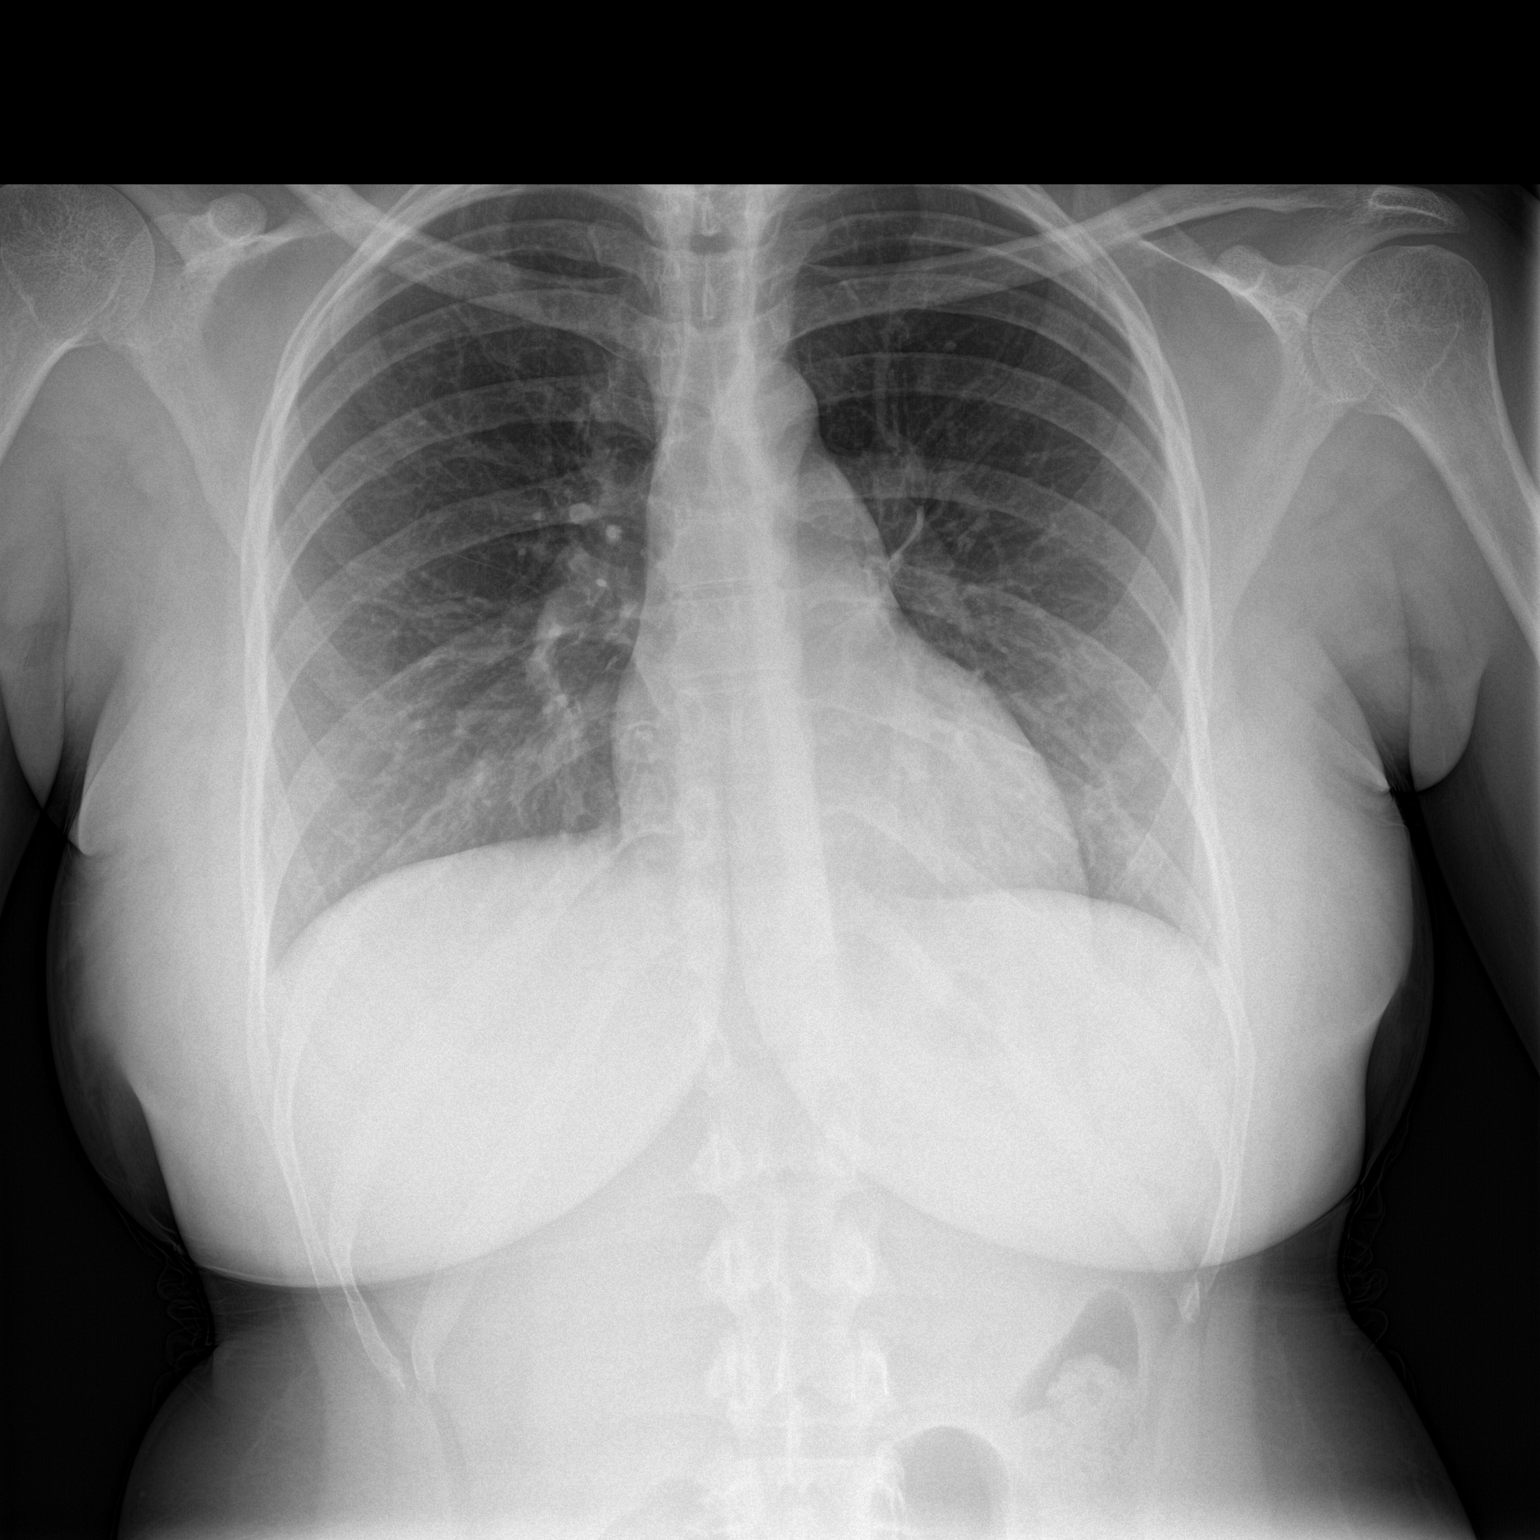

[chest lat]
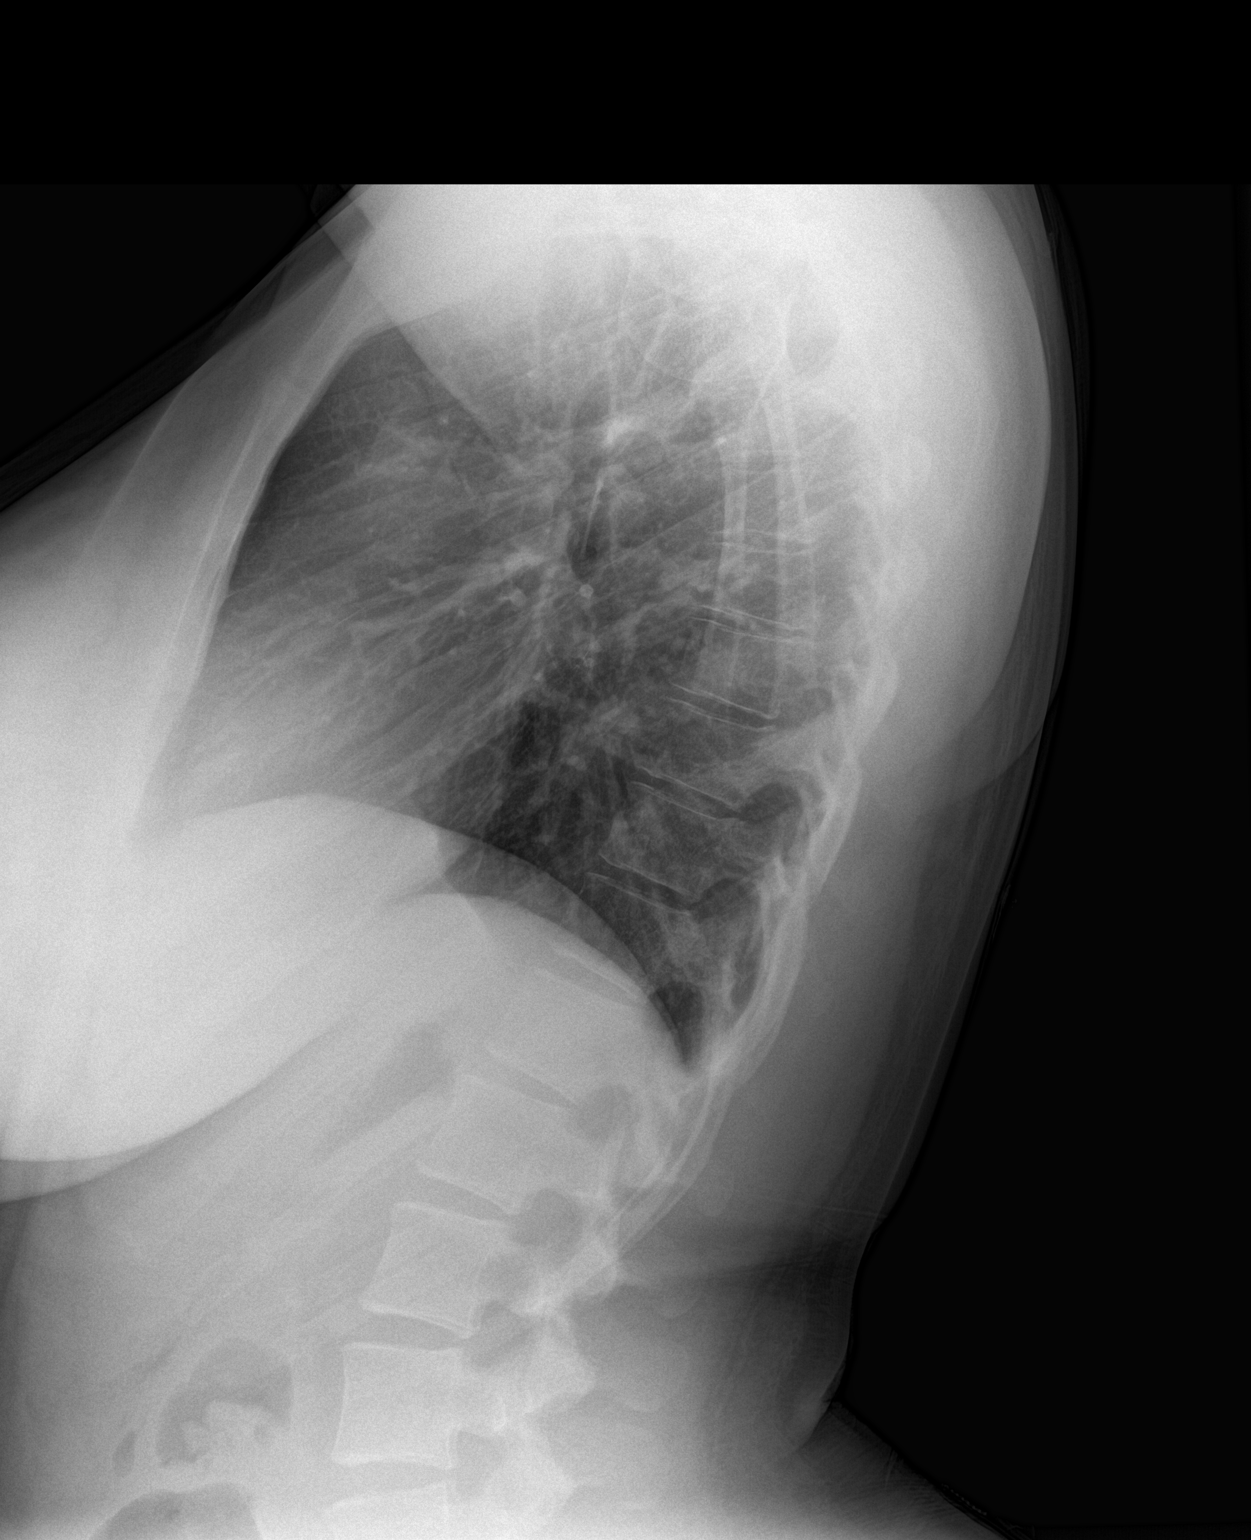

[2 of 2 positions shown; findings below may reference images not displayed]

FINDINGS: The heart size and mediastinal contours are within normal limits.
Both lungs are clear. No pneumothorax or pleural effusion is noted.
The visualized skeletal structures are unremarkable.
IMPRESSION: No acute cardiopulmonary abnormality seen.

## 2015-05-03 IMAGING — US US PELVIS COMPLETE
1 series · 14 of 25 positions shown · non-contrast
Comparison: None.

CLINICAL DATA: Cervical motion tenderness. Four weeks postpartum.
Fever and chills.

EXAM:
TRANSABDOMINAL ULTRASOUND OF PELVIS
TECHNIQUE: Transabdominal ultrasound examination of the pelvis was performed
including evaluation of the uterus, ovaries, adnexal regions, and
pelvic cul-de-sac.

[Series 1: us pelvis complete · 0.22mm/px · 14 of 28 slices shown]
[im 1/28]
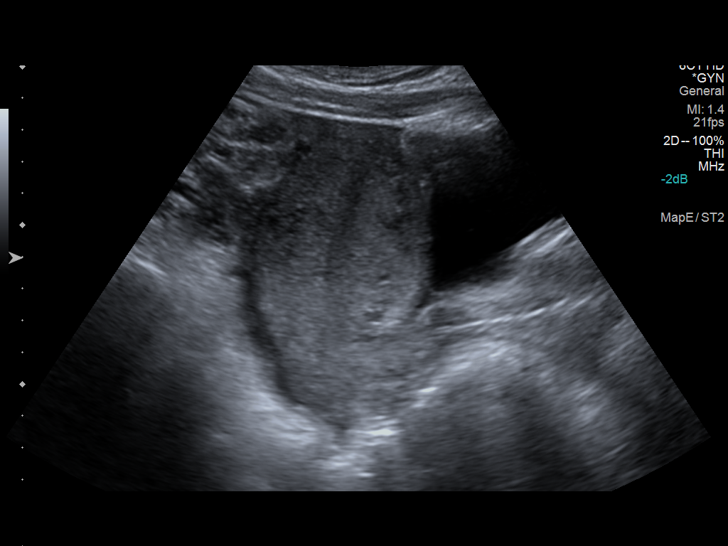
[im 3/28]
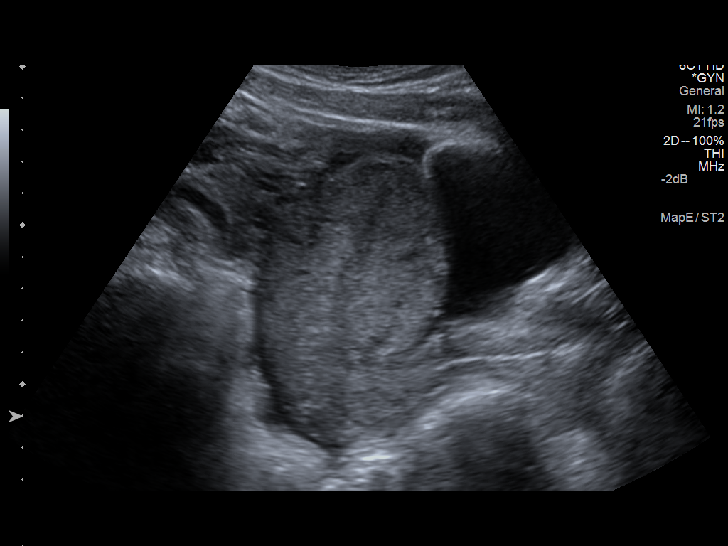
[im 5/28]
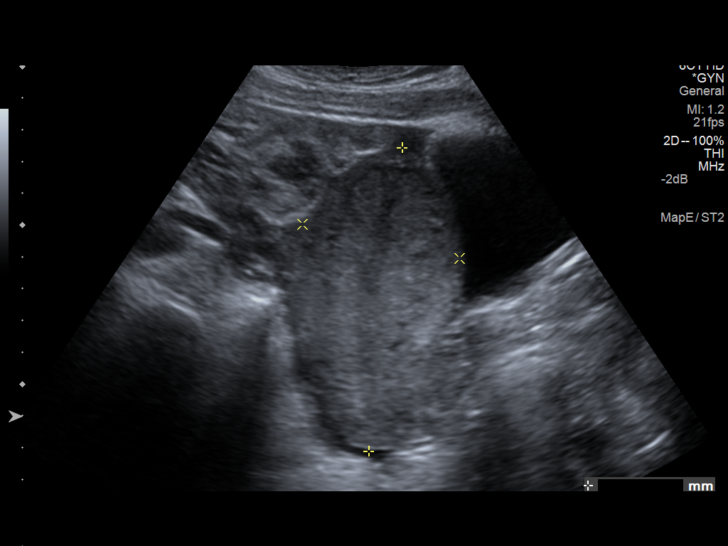
[im 7/28]
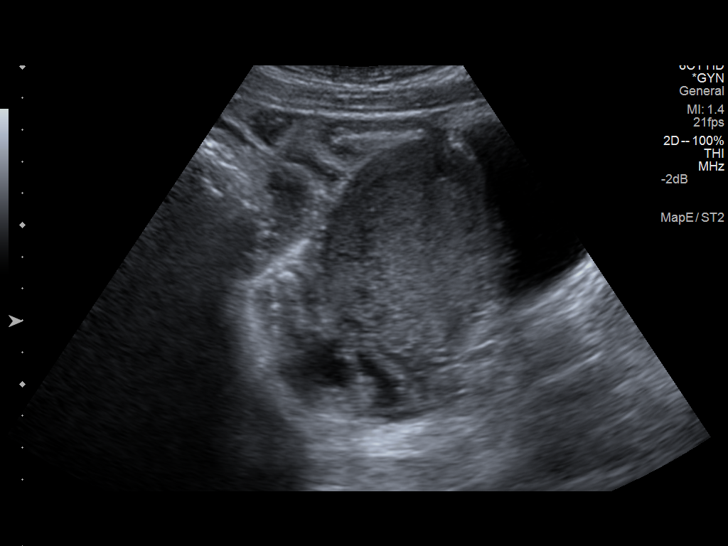
[im 10/28]
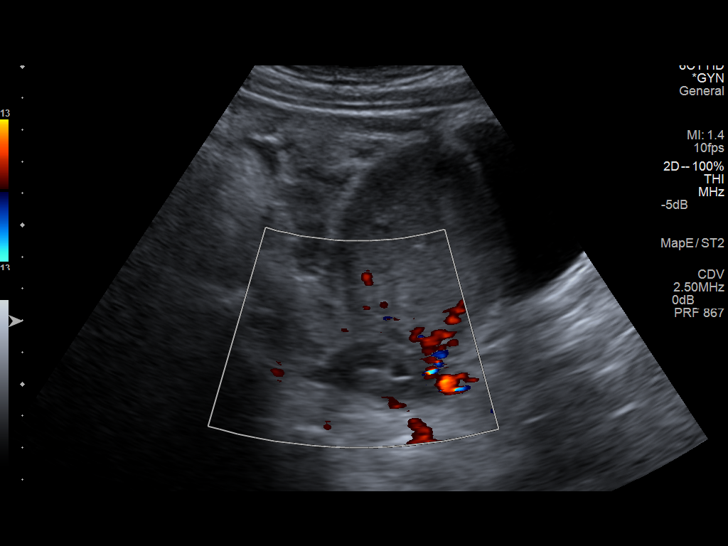
[im 11/28]
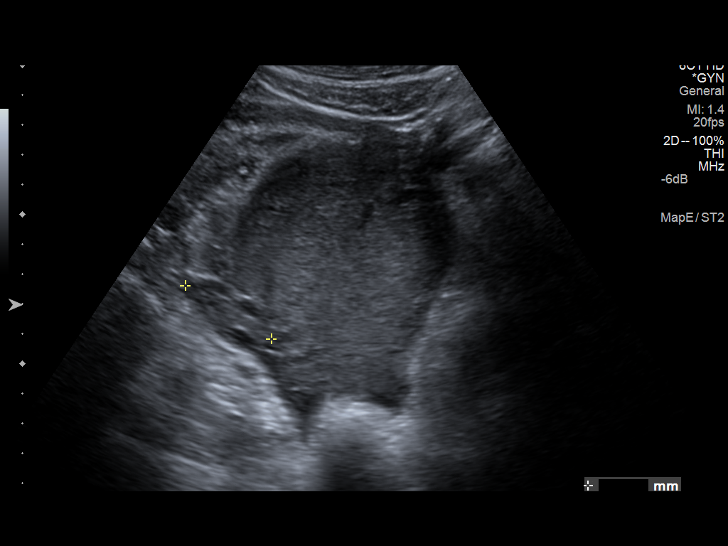
[im 13/28]
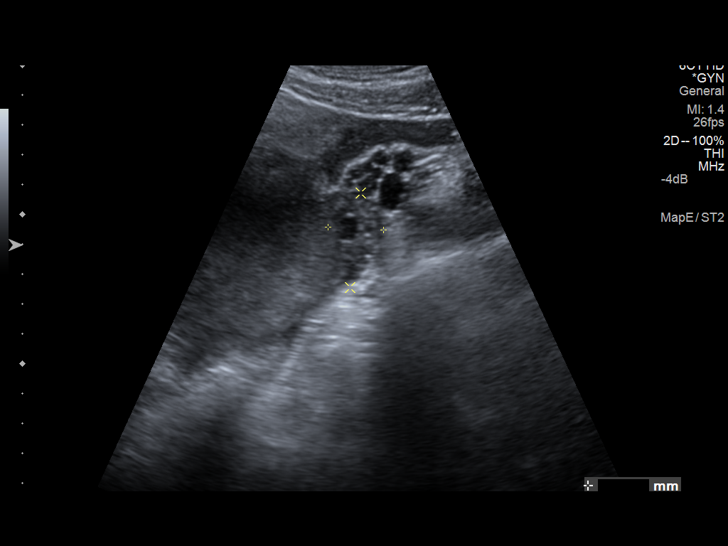
[im 15/28]
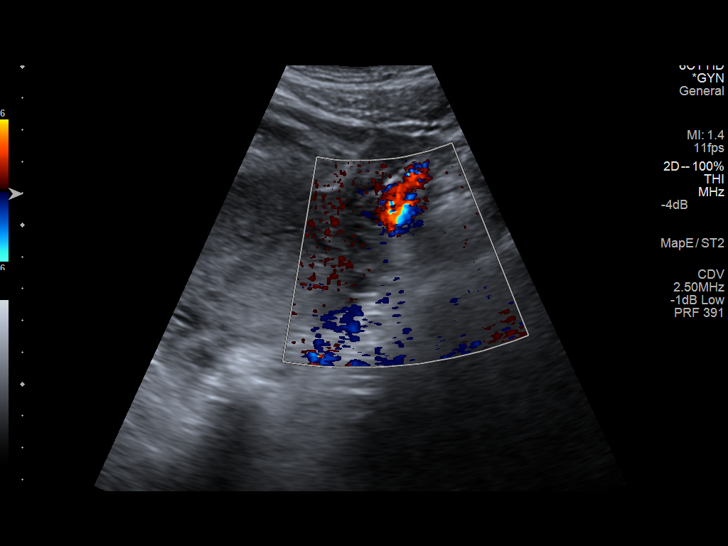
[im 17/28]
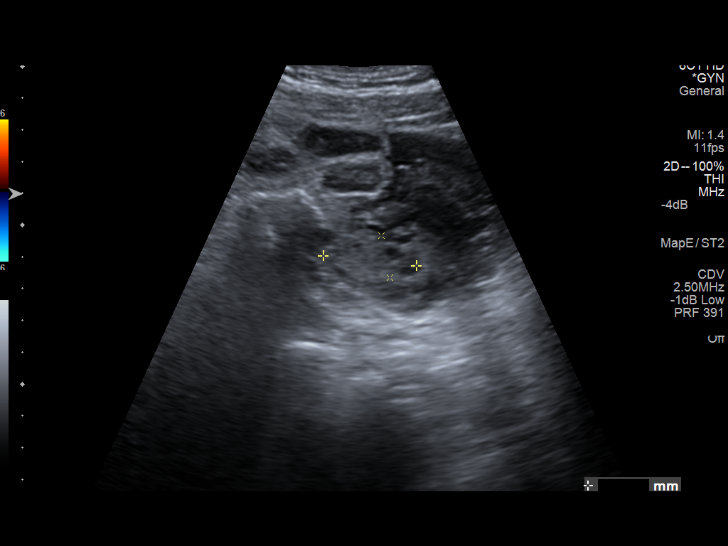
[im 19/28]
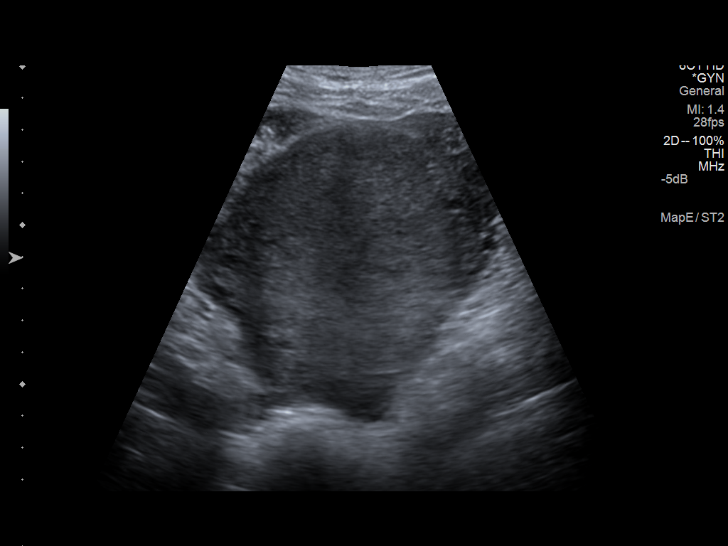
[im 21/28]
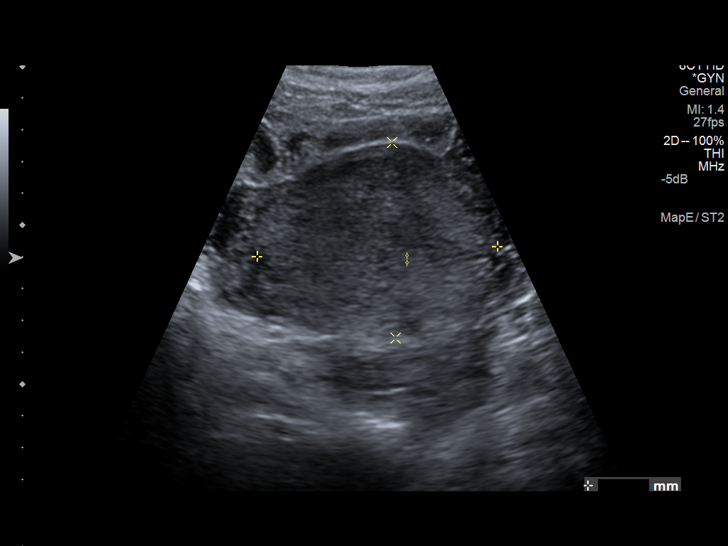
[im 23/28]
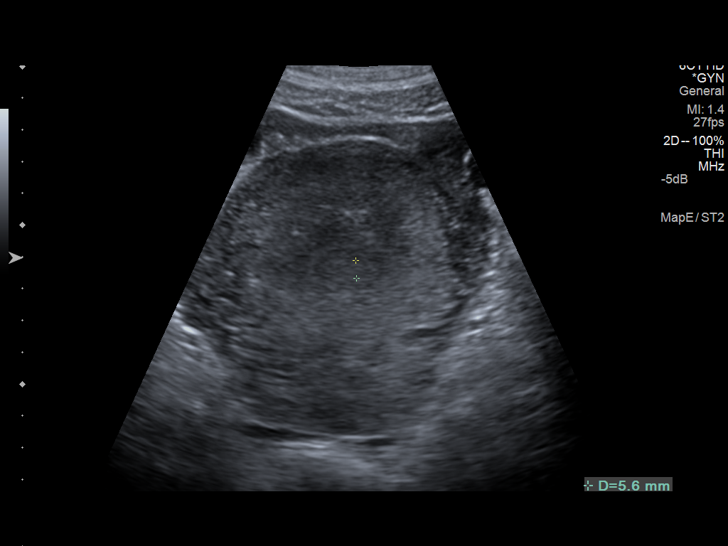
[im 25/28]
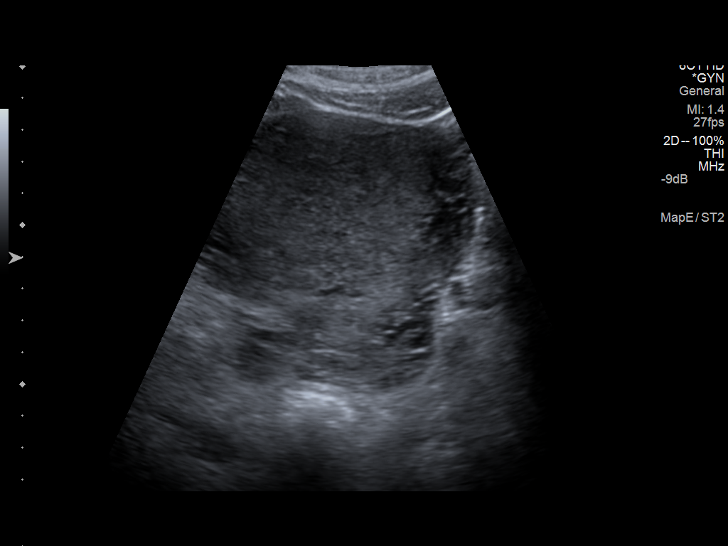
[im 28/28]
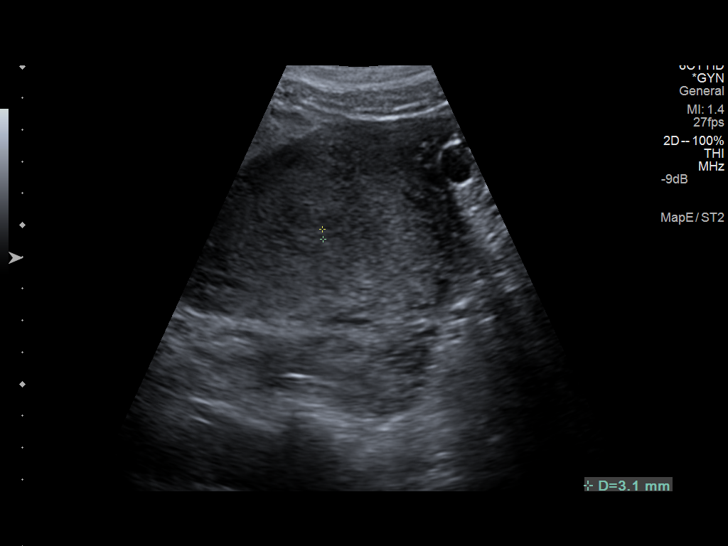

[14 of 25 positions shown; findings below may reference images not displayed]

FINDINGS: Uterus

Measurements: 9.6 x 5.1 x 7.6 cm.. No fibroids or other mass
visualized.

Endometrium

Thickness: 6 mm, within normal limits. No focal abnormality
visualized.

Right ovary

Measurements: 4.0 x 1.7 x 3.4 cm, within normal limits. Normal
appearance/no adnexal mass.

Left ovary

Measurements: 3.0 x 3.2 x 1.9 cm, within normal limits. Normal
appearance/no adnexal mass.

Other findings:  No free fluid
IMPRESSION: Normal sonographic appearance of the uterus and ovaries.
